# Patient Record
Sex: Female | Born: 1990 | Race: White | Hispanic: No | Marital: Married | Smoking: Never smoker
Health system: Southern US, Community
[De-identification: ages and names within clinical notes are randomized; demographics above are authoritative.]

## PROBLEM LIST (undated history)

## (undated) DIAGNOSIS — Z8619 Personal history of other infectious and parasitic diseases: Secondary | ICD-10-CM

## (undated) DIAGNOSIS — F329 Major depressive disorder, single episode, unspecified: Secondary | ICD-10-CM

## (undated) DIAGNOSIS — F419 Anxiety disorder, unspecified: Secondary | ICD-10-CM

## (undated) DIAGNOSIS — F429 Obsessive-compulsive disorder, unspecified: Secondary | ICD-10-CM

## (undated) DIAGNOSIS — F32A Depression, unspecified: Secondary | ICD-10-CM

## (undated) HISTORY — DX: Major depressive disorder, single episode, unspecified: F32.9

## (undated) HISTORY — DX: Obsessive-compulsive disorder, unspecified: F42.9

## (undated) HISTORY — DX: Personal history of other infectious and parasitic diseases: Z86.19

## (undated) HISTORY — DX: Depression, unspecified: F32.A

## (undated) HISTORY — DX: Anxiety disorder, unspecified: F41.9

---

## 2017-08-05 ENCOUNTER — Ambulatory Visit: Payer: BLUE CROSS/BLUE SHIELD | Admitting: Family Medicine

## 2017-08-05 DIAGNOSIS — M79605 Pain in left leg: Secondary | ICD-10-CM | POA: Diagnosis not present

## 2017-08-05 DIAGNOSIS — M79604 Pain in right leg: Secondary | ICD-10-CM

## 2017-08-05 NOTE — Patient Instructions (Signed)
Calf sleeves may be beneficial for overuse of your anterior compartment. For your ankle instability and this overuse do theraband strengthening exercises 3 sets of 10 each direction once a day. I don't think you need an ankle brace for this. Proprioception exercises:  Single leg stance 15 seconds, reaching 3 sets of 10, toe touch 3 sets of 10.  When these are easy you can do them standing on a pillow.  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 Straight leg raise, hip side raises, straight leg raises with foot turned outwards 3 sets of 10 once a day. Add ankle weight if these become too easy. Consider formal physical therapy. Correct foot breakdown with something like dr. Jari Sportsmanscholls active series, spencos, or our green sports insoles. Avoid flat shoes, barefoot walking as much as possible. Icing 15 minutes at a time 3-4 times a day as needed including after exercising to this and/or your shins. Tylenol or ibuprofen as needed for pain. Follow up with me in 6 weeks.

## 2017-08-09 ENCOUNTER — Encounter: Payer: Self-pay | Admitting: Family Medicine

## 2017-08-09 DIAGNOSIS — M79604 Pain in right leg: Secondary | ICD-10-CM | POA: Insufficient documentation

## 2017-08-09 DIAGNOSIS — M79605 Pain in left leg: Principal | ICD-10-CM

## 2017-08-09 NOTE — Assessment & Plan Note (Signed)
her exam is reassuring.  Location of pain along with occurring with increase in activity level consistent with patellofemoral syndrome of knees and overuse anterior compartment muscles.  Shown home exercises and stretches of ankle, lower leg as well as patellofemoral exercises.  Consider arch supports though her long arches are relatively well preserved.  Shown proprioception exercises as she's reported her right ankle has given way at times suggestive of a dynamic instability as ligaments are intact.  Icing, tylenol, ibuprofen if needed.

## 2017-08-09 NOTE — Progress Notes (Signed)
PCP and consultation requested by: Dois Davenport, MD  Subjective:   HPI: Patient is a 27 y.o. female here for bilateral lower leg pain.  Patient reports she's had over 2 months of problems with both of her lower legs. She has been working out regularly doing [employer] 3-4 times a week and wanted to make sure she wasn't damaging anything or worsening any conditions. Pain started in anterior knees, also with pain in anterior shins more to lateral side of shins into dorsal feet. States pain into dorsal feet is what limits her the most. Has not tried any medicines or icing. Just bought new shoes today but hasn't tried these. Pain is sharp. No prior issues. No skin changes, numbness.   History reviewed. No pertinent past medical history.  Current Outpatient Medications on File Prior to Visit  Medication Sig Dispense Refill  . sertraline (ZOLOFT) 100 MG tablet TK 3 TS PO D  4  . Vitamin D, Ergocalciferol, (DRISDOL) 50000 units CAPS capsule   0   No current facility-administered medications on file prior to visit.     History reviewed. No pertinent surgical history.  Not on File  Social History   Socioeconomic History  . Marital status: Married    Spouse name: Not on file  . Number of children: Not on file  . Years of education: Not on file  . Highest education level: Not on file  Occupational History  . Not on file  Social Needs  . Financial resource strain: Not on file  . Food insecurity:    Worry: Not on file    Inability: Not on file  . Transportation needs:    Medical: Not on file    Non-medical: Not on file  Tobacco Use  . Smoking status: Not on file  Substance and Sexual Activity  . Alcohol use: Not on file  . Drug use: Not on file  . Sexual activity: Not on file  Lifestyle  . Physical activity:    Days per week: Not on file    Minutes per session: Not on file  . Stress: Not on file  Relationships  . Social connections:    Talks on phone: Not on  file    Gets together: Not on file    Attends religious service: Not on file    Active member of club or organization: Not on file    Attends meetings of clubs or organizations: Not on file    Relationship status: Not on file  . Intimate partner violence:    Fear of current or ex partner: Not on file    Emotionally abused: Not on file    Physically abused: Not on file    Forced sexual activity: Not on file  Other Topics Concern  . Not on file  Social History Narrative  . Not on file    History reviewed. No pertinent family history.  BP 105/61   Ht 5\' 6"  (1.676 m)   Wt 154 lb (69.9 kg)   BMI 24.86 kg/m   Review of Systems: See HPI above.     Objective:  Physical Exam:  Gen: NAD, comfortable in exam room  Right knee: No gross deformity, ecchymoses, swelling. No TTP. FROM with 5/5 strength. Negative ant/post drawers. Negative valgus/varus testing. Negative lachmanns. Negative mcmurrays, apleys, patellar apprehension. NV intact distally.  Left knee: No gross deformity, ecchymoses, swelling. No TTP. FROM with 5/5 strength. Negative ant/post drawers. Negative valgus/varus testing. Negative lachmanns. Negative mcmurrays, apleys,  patellar apprehension. NV intact distally.  Right ankle/lower leg: No gross deformity, swelling, ecchymoses FROM with 5/5 strength TTP minimally over anterior compartment, dorsum of foot over extensor digitorum. Negative ant drawer and talar tilt.   Negative syndesmotic compression. Thompsons test negative. NV intact distally.  Left ankle/lower leg: No gross deformity, swelling, ecchymoses FROM with 5/5 strength TTP minimally over anterior compartment, dorsum of foot over extensor digitorum. Negative ant drawer and talar tilt.   Negative syndesmotic compression. Thompsons test negative. NV intact distally.   Assessment & Plan:  1. Bilateral leg pain - her exam is reassuring.  Location of pain along with occurring with increase in  activity level consistent with patellofemoral syndrome of knees and overuse anterior compartment muscles.  Shown home exercises and stretches of ankle, lower leg as well as patellofemoral exercises.  Consider arch supports though her long arches are relatively well preserved.  Shown proprioception exercises as she's reported her right ankle has given way at times suggestive of a dynamic instability as ligaments are intact.  Icing, tylenol, ibuprofen if needed.

## 2017-09-16 ENCOUNTER — Ambulatory Visit: Payer: BLUE CROSS/BLUE SHIELD | Admitting: Family Medicine

## 2017-12-10 ENCOUNTER — Ambulatory Visit: Payer: Self-pay | Admitting: Psychiatry

## 2018-02-16 ENCOUNTER — Telehealth: Payer: Self-pay | Admitting: Psychiatry

## 2018-02-16 NOTE — Telephone Encounter (Signed)
[facility] and will submit PA

## 2018-02-16 NOTE — Telephone Encounter (Signed)
Pt called to check status on PA for Sertaline 100mg  3/d Called Pt back [facility]. (Good Rx)

## 2018-03-08 ENCOUNTER — Ambulatory Visit (INDEPENDENT_AMBULATORY_CARE_PROVIDER_SITE_OTHER): Payer: PRIVATE HEALTH INSURANCE | Admitting: Psychiatry

## 2018-03-08 ENCOUNTER — Encounter: Payer: Self-pay | Admitting: Psychiatry

## 2018-03-08 DIAGNOSIS — F422 Mixed obsessional thoughts and acts: Secondary | ICD-10-CM | POA: Diagnosis not present

## 2018-03-08 DIAGNOSIS — F4001 Agoraphobia with panic disorder: Secondary | ICD-10-CM | POA: Diagnosis not present

## 2018-03-08 MED ORDER — ALPRAZOLAM 0.5 MG PO TABS: 0.5000 mg | ORAL_TABLET | Freq: Two times a day (BID) | ORAL | 0 refills | Status: DC | PRN

## 2018-03-08 MED ORDER — SERTRALINE HCL 100 MG PO TABS: 100.0000 mg | ORAL_TABLET | Freq: Every day | ORAL | 1 refills | Status: DC

## 2018-03-08 MED ORDER — SERTRALINE HCL 100 MG PO TABS: 200.0000 mg | ORAL_TABLET | Freq: Every day | ORAL | 1 refills | Status: DC

## 2018-03-08 NOTE — Progress Notes (Signed)
Gina Gillespie [id number] 05-21-1990 28 y.o.  Subjective:   Patient ID:  Gina Gillespie is a 28 y.o. (DOB 09-10-1990) female.  Chief Complaint:  Chief Complaint  Patient presents with  . Anxiety  . Follow-up    med management    HPI [name] presents to the office today for follow-up of OCD.  Good.  Pleased with Zoloft.  Will need to split the RX bc new insurance won't cover 300 mg sertraline.  Asks for a couple of Xanax when has bad panic.  It' rare.  Last one before Thanksgiving and was spontaneous.  Patient reports stable mood and denies depressed or irritable moods.  Patient denies any recent difficulty with anxiety, usually.  OCD managed generally. Patient denies difficulty with sleep initiation or maintenance. Denies appetite disturbance.  Patient reports that energy and motivation have been good.  Patient denies any difficulty with concentration.  Patient denies any suicidal ideation.  Youngest child may interfere with sleep.  2 kids  Review of Systems:  Review of Systems  Constitutional:       Night sweats   Neurological: Negative for tremors and weakness.  Psychiatric/Behavioral: Negative for agitation, behavioral problems, confusion, decreased concentration, dysphoric mood, hallucinations, self-injury, sleep disturbance and suicidal ideas. The patient is nervous/anxious. The patient is not hyperactive.     Medications: I have reviewed the patient's current medications.  Current Outpatient Medications  Medication Sig Dispense Refill  . ALPRAZolam (XANAX) 0.5 MG tablet Take 1 tablet (0.5 mg total) by mouth 2 (two) times daily as needed for anxiety. 30 tablet 0  . sertraline (ZOLOFT) 100 MG tablet Take 2 tablets (200 mg total) by mouth daily. 180 tablet 1  . sertraline (ZOLOFT) 100 MG tablet Take 1 tablet (100 mg total) by mouth daily. 90 tablet 1  . Vitamin D, Ergocalciferol, (DRISDOL) 50000 units CAPS capsule   0   No current facility-administered  medications for this visit.     Medication Side Effects: Other: reduced libido and weight gain and night sweats  Allergies: Not on File  History reviewed. No pertinent past medical history.  History reviewed. No pertinent family history.  Social History   Socioeconomic History  . Marital status: Married    Spouse name: Not on file  . Number of children: Not on file  . Years of education: Not on file  . Highest education level: Not on file  Occupational History  . Not on file  Social Needs  . Financial resource strain: Not on file  . Food insecurity:    Worry: Not on file    Inability: Not on file  . Transportation needs:    Medical: Not on file    Non-medical: Not on file  Tobacco Use  . Smoking status: Not on file  Substance and Sexual Activity  . Alcohol use: Not on file  . Drug use: Not on file  . Sexual activity: Not on file  Lifestyle  . Physical activity:    Days per week: Not on file    Minutes per session: Not on file  . Stress: Not on file  Relationships  . Social connections:    Talks on phone: Not on file    Gets together: Not on file    Attends religious service: Not on file    Active member of club or organization: Not on file    Attends meetings of clubs or organizations: Not on file    Relationship status: Not on file  . Intimate partner  violence:    Fear of current or ex partner: Not on file    Emotionally abused: Not on file    Physically abused: Not on file    Forced sexual activity: Not on file  Other Topics Concern  . Not on file  Social History Narrative  . Not on file    Past Medical History, Surgical history, Social history, and Family history were reviewed and updated as appropriate.   Please see review of systems for further details on the patient's review from today.   Objective:   Physical Exam:  There were no vitals taken for this visit.  Physical Exam Constitutional:      General: She is not in acute distress.     Appearance: She is well-developed.  Musculoskeletal:        General: No deformity.  Neurological:     Mental Status: She is alert and oriented to person, place, and time.     Motor: No tremor.     Coordination: Coordination normal.     Gait: Gait normal.  Psychiatric:        Attention and Perception: Attention normal. She is attentive.        Mood and Affect: Mood is anxious. Mood is not depressed. Affect is not labile, blunt, angry or inappropriate.        Speech: Speech normal.        Behavior: Behavior normal.        Thought Content: Thought content normal. Thought content does not include homicidal or suicidal ideation. Thought content does not include homicidal or suicidal plan.        Cognition and Memory: Cognition normal.        Judgment: Judgment normal.     Comments: Insight is good.     Lab Review:  No results found for: NA, K, CL, CO2, GLUCOSE, BUN, CREATININE, CALCIUM, PROT, ALBUMIN, AST, ALT, ALKPHOS, BILITOT, GFRNONAA, GFRAA  No results found for: WBC, RBC, HGB, HCT, PLT, MCV, MCH, MCHC, RDW, LYMPHSABS, MONOABS, EOSABS, BASOSABS  No results found for: POCLITH, LITHIUM   No results found for: PHENYTOIN, PHENOBARB, VALPROATE, CBMZ   .res Assessment: Plan:    Mixed obsessional thoughts and acts  Panic disorder with agoraphobia  Try splitting up the sertraline instead of all at HS to reduce the night sweats.  Needs high dosage for OCD.  Disc SE some of which she has.  OK rare Xanax for panic.  We discussed the short-term risks associated with benzodiazepines including sedation and increased fall risk among others.  Discussed long-term side effect risk including dependence, potential withdrawal symptoms, and the potential eventual dose-related risk of dementia.  FU 6 mos  Meredith Staggers, MD, DFAPA    Please see After Visit Summary for patient specific instructions.  No future appointments.  No orders of the defined types were placed in this encounter.      -------------------------------

## 2018-03-22 ENCOUNTER — Ambulatory Visit (INDEPENDENT_AMBULATORY_CARE_PROVIDER_SITE_OTHER): Payer: PRIVATE HEALTH INSURANCE | Admitting: Family Medicine

## 2018-03-22 ENCOUNTER — Encounter: Payer: Self-pay | Admitting: Family Medicine

## 2018-03-22 VITALS — BP 102/70 | HR 83 | Temp 97.9°F | Ht 66.0 in | Wt 154.4 lb

## 2018-03-22 DIAGNOSIS — J029 Acute pharyngitis, unspecified: Secondary | ICD-10-CM

## 2018-03-22 DIAGNOSIS — F411 Generalized anxiety disorder: Secondary | ICD-10-CM | POA: Insufficient documentation

## 2018-03-22 LAB — POCT RAPID STREP A (OFFICE): RAPID STREP A SCREEN: NEGATIVE

## 2018-03-22 MED ORDER — FLUTICASONE PROPIONATE 50 MCG/ACT NA SUSP: 2.0000 | Freq: Every day | NASAL | 6 refills | Status: DC

## 2018-03-22 NOTE — Patient Instructions (Signed)
Let me know if not better by Wednesday!  Postnasal Drip Postnasal drip is the feeling of mucus going down the back of your throat. Mucus is a slimy substance that moistens and cleans your nose and throat, as well as the air pockets in face bones near your forehead and cheeks (sinuses). Small amounts of mucus pass from your nose and sinuses down the back of your throat all the time. This is normal. When you produce too much mucus or the mucus gets too thick, you can feel it. Some common causes of postnasal drip include:  Having more mucus because of: ? A cold or the flu. ? Allergies. ? Cold air. ? Certain medicines.  Having more mucus that is thicker because of: ? A sinus or nasal infection. ? Dry air. ? A food allergy. Follow these instructions at home: Relieving discomfort   Gargle with a salt-water mixture 3-4 times a day or as needed. To make a salt-water mixture, completely dissolve -1 tsp of salt in 1 cup of warm water.  If the air in your home is dry, use a humidifier to add moisture to the air.  Use a saline spray or container (neti pot) to flush out the nose (nasal irrigation). These methods can help clear away mucus and keep the nasal passages moist. General instructions  Take over-the-counter and prescription medicines only as told by your health care provider.  Follow instructions from your health care provider about eating or drinking restrictions. You may need to avoid caffeine.  Avoid things that you know you are allergic to (allergens), like dust, mold, pollen, pets, or certain foods.  Drink enough fluid to keep your urine pale yellow.  Keep all follow-up visits as told by your health care provider. This is important. Contact a health care provider if:  You have a fever.  You have a sore throat.  You have difficulty swallowing.  You have headache.  You have sinus pain.  You have a cough that does not go away.  The mucus from your nose becomes thick  and is green or yellow in color.  You have cold or flu symptoms that last more than 10 days. Summary  Postnasal drip is the feeling of mucus going down the back of your throat.  If your health care provider approves, use nasal irrigation or a nasal spray 2?4 times a day.  Avoid things that you know you are allergic to (allergens), like dust, mold, pollen, pets, or certain foods. This information is not intended to replace advice given to you by your health care provider. Make sure you discuss any questions you have with your health care provider. Document Released: 04/28/2016 Document Revised: 04/28/2016 Document Reviewed: 04/28/2016 Elsevier Interactive Patient Education  2019 [employer].

## 2018-03-22 NOTE — Progress Notes (Signed)
Patient: Gina Gillespie MRN: [id number] DOB: 1990/02/17 PCP: Orland Mustard, MD     Subjective:  Chief Complaint  Patient presents with  . Sore Throat  . Nasal Congestion    HPI: The patient is a 28 y.o. female who presents today for sore throat and congestion. She started to feel bad over a week ago with congestion, gunk in her eyes. She states about 2 days ago her throat started to hurt, but is better today. It feels like something is in her throat. No fever/chills. Her daughter's caregiver had strep, but her daughter has been fine. No cough, shortness of breath or wheezing. She does not smoke or have asthma. No COPD. She took generic cold and sinus yesterday. She is still breast feeding her daughter.   Review of Systems  Constitutional: Negative for chills, fatigue and fever.  HENT: Positive for congestion, ear pain, postnasal drip, rhinorrhea, sore throat and trouble swallowing. Negative for sinus pressure and sinus pain.        C/o b/l ear pain, R>L  Eyes: Negative for photophobia and pain.  Respiratory: Negative for cough and shortness of breath.   Cardiovascular: Negative for chest pain.  Gastrointestinal: Negative for abdominal pain and nausea.  Musculoskeletal: Negative for arthralgias, back pain, myalgias and neck pain.  Neurological: Positive for headaches. Negative for dizziness.  Hematological: Positive for adenopathy.       C/o swelling/tenderness in submandibular glands  Psychiatric/Behavioral: Negative for sleep disturbance.    Allergies Patient has No Known Allergies.  Past Medical History Patient  has a past medical history of Anxiety and OCD (obsessive compulsive disorder).  Surgical History Patient  has no past surgical history on file.  Family History Pateint's family history is not on file.  Social History Patient  reports that she has never smoked. She has never used smokeless tobacco.    Objective: Vitals:   03/22/18 1455  BP: 102/70   Pulse: 83  Temp: 97.9 F (36.6 C)  TempSrc: Oral  SpO2: 98%  Weight: 154 lb 6.4 oz (70 kg)  Height: 5\' 6"  (1.676 m)    Body mass index is 24.92 kg/m.  Physical Exam Vitals signs reviewed.  Constitutional:      Appearance: She is well-developed.  HENT:     Head: Normocephalic and atraumatic.     Right Ear: Tympanic membrane and ear canal normal.     Left Ear: Tympanic membrane and ear canal normal.     Nose: No congestion.     Mouth/Throat:     Tonsils: No tonsillar abscesses. Swelling: 0 on the right. 0 on the left.     Comments: +cobblestoning on posterior pharynx  Neck:     Musculoskeletal: Normal range of motion and neck supple.  Cardiovascular:     Rate and Rhythm: Normal rate and regular rhythm.     Heart sounds: Normal heart sounds.  Lymphadenopathy:     Cervical: No cervical adenopathy.  Skin:    General: Skin is warm and dry.     Capillary Refill: Capillary refill takes less than 2 seconds.  Neurological:     General: No focal deficit present.     Mental Status: She is alert and oriented to person, place, and time.    Strep: negative     Assessment/plan: 1. Sore throat Likely secondary to post nasal drip vs. Viral pharyngitis. Starting her on flonase at night. Safe with breastfeeding. She is to let me know if not getting better.  - POCT rapid strep  A     Return if symptoms worsen or fail to improve.   Orland Mustard, MD [facility]   03/22/2018

## 2018-04-12 ENCOUNTER — Ambulatory Visit: Payer: Self-pay | Admitting: Family Medicine

## 2018-07-05 ENCOUNTER — Other Ambulatory Visit: Payer: Self-pay

## 2018-07-05 ENCOUNTER — Telehealth: Payer: Self-pay | Admitting: Psychiatry

## 2018-07-05 ENCOUNTER — Encounter (HOSPITAL_COMMUNITY): Payer: Self-pay

## 2018-07-05 ENCOUNTER — Inpatient Hospital Stay (HOSPITAL_COMMUNITY)
Admission: RE | Admit: 2018-07-05 | Discharge: 2018-07-07 | DRG: 832 | Disposition: A | Discharge status: Home / Self Care | Payer: PRIVATE HEALTH INSURANCE | Attending: Psychiatry | Admitting: Psychiatry

## 2018-07-05 DIAGNOSIS — R45851 Suicidal ideations: Secondary | ICD-10-CM | POA: Diagnosis not present

## 2018-07-05 DIAGNOSIS — F332 Major depressive disorder, recurrent severe without psychotic features: Secondary | ICD-10-CM | POA: Diagnosis not present

## 2018-07-05 DIAGNOSIS — F410 Panic disorder [episodic paroxysmal anxiety] without agoraphobia: Secondary | ICD-10-CM | POA: Diagnosis present

## 2018-07-05 DIAGNOSIS — Z3A01 Less than 8 weeks gestation of pregnancy: Secondary | ICD-10-CM

## 2018-07-05 DIAGNOSIS — F322 Major depressive disorder, single episode, severe without psychotic features: Secondary | ICD-10-CM | POA: Diagnosis present

## 2018-07-05 DIAGNOSIS — O99341 Other mental disorders complicating pregnancy, first trimester: Principal | ICD-10-CM | POA: Diagnosis present

## 2018-07-05 MED ORDER — ALUM & MAG HYDROXIDE-SIMETH 200-200-20 MG/5ML PO SUSP: 30.0000 mL | ORAL | Status: DC | PRN

## 2018-07-05 MED ORDER — SERTRALINE HCL 100 MG PO TABS
200.0000 mg | ORAL_TABLET | Freq: Every day | ORAL | Status: DC
  Administered 2018-07-05 – 2018-07-06 (×2): 200 mg via ORAL
  Filled 2018-07-05 (×4): qty 2

## 2018-07-05 MED ORDER — ACETAMINOPHEN 325 MG PO TABS: 650.0000 mg | ORAL_TABLET | Freq: Four times a day (QID) | ORAL | Status: DC | PRN

## 2018-07-05 MED ORDER — MAGNESIUM HYDROXIDE 400 MG/5ML PO SUSP: 30.0000 mL | Freq: Every day | ORAL | Status: DC | PRN

## 2018-07-05 MED ORDER — DIPHENHYDRAMINE HCL 25 MG PO CAPS
25.0000 mg | ORAL_CAPSULE | Freq: Once | ORAL | Status: AC | PRN
  Administered 2018-07-05: 22:00:00 25 mg via ORAL
  Filled 2018-07-05: qty 1

## 2018-07-05 NOTE — Plan of Care (Signed)
[place] Observation Crisis Plan  Reason for Crisis Plan:  Crisis Stabilization   Plan of Care:  Referral for Telepsychiatry/Psychiatric Consult  Family Support:      Current Living Environment:  Living Arrangements: Spouse/significant other, Children  Insurance:   Hospital Account    Name Acct ID Class Status Primary Coverage   Gina Gillespie, Gina Gillespie [id number] [place]        Guarantor Account (for Hospital Account [id number])    Name Relation to Pt Service Area Active? Acct Type   [name] [facility] Yes [facility]al [facility]   Address Phone       [address] [place], [place] [address] [phone])          Coverage Information (for Hospital Account [id number])    F/O Payor/Plan Precert #   [place] #   Gina Gillespie, Gina Gillespie [id number]   Address Phone   [place] [address] [phone]      Legal Guardian:  Legal Guardian: Other:(N/A)  Primary Care Provider:  Orma Flaming, MD  Current Outpatient Providers:  [facility]  Psychiatrist:  Name of Psychiatrist: Dr. Clovis Pu - [facility]; has been seeing for approximately 3 years  Counselor/Therapist:  Name of Therapist: None  Compliant with Medications:  Yes  Additional Information:   Gina Gillespie 6/8/202010:03 PM

## 2018-07-05 NOTE — Telephone Encounter (Signed)
Phone call after hours at [place] patient having acute marital distress of several days with sobbng anxious breakdown tonight when husband was crying telling her to go back to her country.  She states neither has family in the area but she wants to take care of her children and live long enough to do so.  She has a death wish about which she is obsessing to the point that she expects she will kill herself if not provided protection.  She delivered her 2 children at home as she is afraid of hospitals likely obsessing about contamination and death.  She had therapy with Barron Schmid, PhD until discontinuing reporting she needs a female therapist but did not find one right away.  She apparently takes sertraline from Dr. Clovis Pu and at last appointment in February was apparently doing well.  She is highly concerned about economics and suggests her phone is nearly out of power so that she will use it to drive herself to [facility] for being there before but knowing the way as she lives in the area.  She is less threatening by the end of the phone intervention but does not acknowledge feeling or functioning better otherwise she carefully plans to seek emergency assessment by access and intake crisis at [facility] for possible acute admission.

## 2018-07-05 NOTE — Progress Notes (Signed)
Gina Gillespie is a 28 year old female being admitted to [facility] Obs unit room 203 as a walk in to [facility].  She came in for suicidal ideation that has worsened over the past 2 days.  She reported thinking about running into traffic.  Current stressors are husband becoming distant due to her extra marital affair, being pregnant, unsure who the baby's father is and history of anxiety/OCD/depression.  During [facility] Obs admission, she continued to report passive SI and will seek out staff if those feelings worsen.  She denied HI or A/V hallucinations.  She was tearful during admission but was appropriate during admission.  She denied any pain or discomfort and appeared to be in no physical distress.  Oriented her to the unit.  BH-OBS paperwork completed and signed.  Belongings secured in white belongings bag and placed in locker # 67.  No contraband found.  Skin assessment completed and no skin issues noted.  Q 15 minute checks initiated for safety.

## 2018-07-05 NOTE — H&P (Signed)
Behavioral Health Medical Screening Exam  Gina Gillespie is an 28 y.o. female.  Total Time spent with patient: 30 minutes  Psychiatric Specialty Exam: Physical Exam  Constitutional: She is oriented to person, place, and time. She appears well-developed and well-nourished. No distress.  HENT:  Head: Normocephalic and atraumatic.  Right Ear: External ear normal.  Eyes: Pupils are equal, round, and reactive to light. Conjunctivae are normal. Right eye exhibits no discharge.  Respiratory: Effort normal. No respiratory distress.  Musculoskeletal: Normal range of motion.  Neurological: She is alert and oriented to person, place, and time.  Skin: She is not diaphoretic.  Psychiatric: Her mood appears anxious. She is not withdrawn and not actively hallucinating. Thought content is not paranoid and not delusional. She exhibits a depressed mood. She expresses suicidal ideation. She expresses no homicidal ideation. She expresses suicidal plans.    Review of Systems  Constitutional: Negative for chills, diaphoresis, fever, malaise/fatigue and weight loss.  Respiratory: Negative for cough and shortness of breath.   Cardiovascular: Negative for chest pain.  Gastrointestinal: Negative for diarrhea, nausea and vomiting.  Psychiatric/Behavioral: Positive for depression and suicidal ideas. Negative for hallucinations, memory loss and substance abuse. The patient is nervous/anxious. The patient does not have insomnia.     There were no vitals taken for this visit.There is no height or weight on file to calculate BMI.  General Appearance: Casual and Fairly Groomed  Eye Contact:  Minimal  Speech:  Clear and Coherent and Normal Rate  Volume:  Decreased  Mood:  Anxious, Depressed, Hopeless and Worthless  Affect:  Congruent and Tearful  Thought Process:  Coherent, Goal Directed and Descriptions of Associations: Intact  Orientation:  Full (Time, Place, and Person)  Thought Content:  Logical and  Hallucinations: None  Suicidal Thoughts:  Yes.  with intent/plan  Homicidal Thoughts:  No  Memory:  Immediate;   Good Recent;   Fair  Judgement:  Impaired  Insight:  Lacking  Psychomotor Activity:  Normal  Concentration: Concentration: Fair and Attention Span: Fair  Recall:  Good  Fund of Knowledge:Good  Language: Good  Akathisia:  Negative  Handed:  Right  AIMS (if indicated):     Assets:  Communication Skills Desire for Improvement Financial Resources/Insurance Housing Leisure Time Physical Health  Sleep:       Musculoskeletal: Strength & Muscle Tone: within normal limits Gait & Station: normal    Recommendations:  Based on my evaluation the patient does not appear to have an emergency medical condition.  Rozetta Nunnery, NP 07/05/2018, 9:41 PM

## 2018-07-05 NOTE — Telephone Encounter (Signed)
Please ask what sort of symptoms she is having that she feels warrants some sort of residential psychiatric treatment and I will respond.  This is new information to me
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
## 2018-07-05 NOTE — Telephone Encounter (Signed)
Patient would like to speak with you about getting a referral to residential psychiatric place that she can possibly go to.

## 2018-07-06 DIAGNOSIS — O99341 Other mental disorders complicating pregnancy, first trimester: Secondary | ICD-10-CM | POA: Diagnosis not present

## 2018-07-06 DIAGNOSIS — Z3A01 Less than 8 weeks gestation of pregnancy: Secondary | ICD-10-CM | POA: Diagnosis not present

## 2018-07-06 DIAGNOSIS — R45851 Suicidal ideations: Secondary | ICD-10-CM | POA: Diagnosis present

## 2018-07-06 DIAGNOSIS — F332 Major depressive disorder, recurrent severe without psychotic features: Secondary | ICD-10-CM

## 2018-07-06 DIAGNOSIS — F410 Panic disorder [episodic paroxysmal anxiety] without agoraphobia: Secondary | ICD-10-CM | POA: Diagnosis not present

## 2018-07-06 DIAGNOSIS — F322 Major depressive disorder, single episode, severe without psychotic features: Secondary | ICD-10-CM | POA: Diagnosis present

## 2018-07-06 LAB — RAPID URINE DRUG SCREEN, HOSP PERFORMED
Amphetamines: NOT DETECTED
Barbiturates: NOT DETECTED
Benzodiazepines: POSITIVE — AB
Cocaine: NOT DETECTED
Opiates: NOT DETECTED
Tetrahydrocannabinol: NOT DETECTED

## 2018-07-06 LAB — COMPREHENSIVE METABOLIC PANEL
ALT: 17 U/L (ref 0–44)
AST: 16 U/L (ref 15–41)
Albumin: 3.9 g/dL (ref 3.5–5.0)
Alkaline Phosphatase: 52 U/L (ref 38–126)
Anion gap: 7 (ref 5–15)
BUN: 10 mg/dL (ref 6–20)
CO2: 25 mmol/L (ref 22–32)
Calcium: 8.7 mg/dL — ABNORMAL LOW (ref 8.9–10.3)
Chloride: 106 mmol/L (ref 98–111)
Creatinine, Ser: 0.61 mg/dL (ref 0.44–1.00)
GFR calc Af Amer: >60 mL/min (ref >60)
GFR calc non Af Amer: >60 mL/min (ref >60)
Glucose, Bld: 99 mg/dL (ref 70–99)
Potassium: 3.9 mmol/L (ref 3.5–5.1)
Sodium: 138 mmol/L (ref 135–145)
Total Bilirubin: 0.5 mg/dL (ref 0.3–1.2)
Total Protein: 6.5 g/dL (ref 6.5–8.1)

## 2018-07-06 LAB — CBC
HCT: 38.5 % (ref 36.0–46.0)
Hemoglobin: 12.4 g/dL (ref 12.0–15.0)
MCH: 30 pg (ref 26.0–34.0)
MCHC: 32.2 g/dL (ref 30.0–36.0)
MCV: 93 fL (ref 80.0–100.0)
Platelets: 235 10*3/uL (ref 150–400)
RBC: 4.14 MIL/uL (ref 3.87–5.11)
RDW: 12.8 % (ref 11.5–15.5)
WBC: 6.7 10*3/uL (ref 4.0–10.5)
nRBC: 0 % (ref 0.0–0.2)

## 2018-07-06 LAB — LIPID PANEL
Cholesterol: 164 mg/dL (ref 0–200)
HDL: 55 mg/dL (ref >40)
LDL Cholesterol: 101 mg/dL — ABNORMAL HIGH (ref 0–99)
Total CHOL/HDL Ratio: 3 RATIO
Triglycerides: 38 mg/dL (ref <150)
VLDL: 8 mg/dL (ref 0–40)

## 2018-07-06 LAB — HEMOGLOBIN A1C
Hgb A1c MFr Bld: 5.2 % (ref 4.8–5.6)
Mean Plasma Glucose: 102.54 mg/dL

## 2018-07-06 LAB — PREGNANCY, URINE: Preg Test, Ur: POSITIVE — AB

## 2018-07-06 LAB — TSH: TSH: 1.975 u[IU]/mL (ref 0.350–4.500)

## 2018-07-06 MED ORDER — DIPHENHYDRAMINE HCL 25 MG PO CAPS: 50.0000 mg | ORAL_CAPSULE | Freq: Every evening | ORAL | Status: DC | PRN

## 2018-07-06 NOTE — Progress Notes (Signed)
Patient ID: Gina Gillespie, female   DOB: 11-14-1990, 28 y.o.   MRN: [id number]  Admission Note  D) Patient admitted to the adult unit 400 hall. Patient is a 28 year old female who is voluntary from [employer]. Patient presents minimal and guarded stating she does not intend to stay the night here. Patient informed she was being admitted to the unit, however patient states she only agreed to talked to the psychiatrist. Patient came to [facility] voluntarily reporting thoughts of SI. Patient reports she is pregnant and does not know if the baby's father is her husband's or another man's. Patient has history of post partum depression and two other children. Patient minimizing thoughts of SI now and denies HI/AVH.  Skin assessment was completed and unremarkable. Patient belongings searched with no contraband found. Belongings secured in locker. Vital signs obtained.   A) Plan of care, unit policies and patient expectations were explained. Written consents obtained. Patient oriented to the unit and their room. Patient placed on standard q15 safety checks. Low fall risk precautions initiated and reviewed with patient.   R) Patient is in no acute distress and verbalizes understanding of information provided. Patient with no concerns at this time. Patient contracts for safety with staff on the unit.

## 2018-07-06 NOTE — Progress Notes (Signed)
D: Pt denies SI/HI/AVH. Pt is pleasant and cooperative. Pt had minimal information , but seemed as if she were  minimizing her situation. Pt visible on the unit some this evening.    A: Pt was offered support and encouragement. Pt was given scheduled medications. Pt was encourage to attend groups. Q 15 minute checks were done for safety.   R:Pt attends groups and interacts well with peers and staff. Pt is taking medication. Pt has no complaints.Pt receptive to treatment and safety maintained on unit.

## 2018-07-06 NOTE — Discharge Summary (Signed)
  Patient transferred inpatient at [place] for psychiatric treatment  Ritchard Paragas B. Alivia Cimino, NP

## 2018-07-06 NOTE — Progress Notes (Signed)
Spiritual care group on grief and loss facilitated by chaplain Raymone Pembroke  Group Goal:  Support / Education around grief and loss Members engage in facilitated group support and psycho social education.  Group Description:  Following introductions and group rules,  Group members engaged in facilitated group dialog and support around topic of loss, with particular support around experiences of loss in their lives. Group Identified types of loss (relationships / self / things) and identified patterns, circumstances, and changes that precipitate losses. Reflected on thoughts / feelings around loss, normalized grief responses, and recognized variety in grief experience. Patient Progress:  Pt did not attend group 
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
## 2018-07-06 NOTE — Progress Notes (Signed)
Patient ID: Gina Gillespie, female   DOB: 02-15-1990, 28 y.o.   MRN: [id number]  [place] NOVEL CORONAVIRUS (COVID-19) DAILY CHECK-OFF SYMPTOMS - answer yes or no to each - every day NO YES  Have you had a fever in the past 24 hours?  . Fever (Temp > 37.80C / 100F) X   Have you had any of these symptoms in the past 24 hours? . New Cough .  Sore Throat  .  Shortness of Breath .  Difficulty Breathing .  Unexplained Body Aches   X   Have you had any one of these symptoms in the past 24 hours not related to allergies?   . Runny Nose .  Nasal Congestion .  Sneezing   X   If you have had runny nose, nasal congestion, sneezing in the past 24 hours, has it worsened?  X   EXPOSURES - check yes or no X   Have you traveled outside the state in the past 14 days?  X   Have you been in contact with someone with a confirmed diagnosis of COVID-19 or PUI in the past 14 days without wearing appropriate PPE?  X   Have you been living in the same home as a person with confirmed diagnosis of COVID-19 or a PUI (household contact)?    X   Have you been diagnosed with COVID-19?    X              What to do next: Answered NO to all: Answered YES to anything:   Proceed with unit schedule Follow the [facility] Inpatient Flowsheet.

## 2018-07-06 NOTE — Progress Notes (Signed)
Adult Psychoeducational Group Note  Date:  07/06/2018 Time:  9:09 PM  Group Topic/Focus:  Wrap-Up Group:   The focus of this group is to help patients review their daily goal of treatment and discuss progress on daily workbooks.  Participation Level:  Active  Participation Quality:  Appropriate  Affect:  Appropriate  Cognitive:  Appropriate  Insight: Appropriate  Engagement in Group:  Engaged  Modes of Intervention:  Discussion  Additional Comments:  Patient attended group and said her day was a 6. She described her day as being restful.  Priscila Bean W Ersie Savino 09/02/5641, 9:09 PM

## 2018-07-06 NOTE — H&P (Signed)
Psychiatric Admission Assessment Adult  Patient Identification: Gina Gillespie MRN:  [id number] Date of Evaluation:  07/06/2018 Chief Complaint: " I needed to be in a safe place "   Principal Diagnosis: MDD versus Adjustment Disorder with Depressed Mood. OCD per history.  Diagnosis:   MDD versus Adjustment Disorder with Depressed Mood. OCD per history. History of Present Illness:  Patient is a 28 year old married female, presented to ED voluntarily on 6/8, due to depression, suicidal ideations, with thoughts of crashing car. Reports significant stressors, including marital stressors- reports she had recently told him about an extramarital affair, and she is unsure if marriage will survive. She also recently found out she is pregnant ( currently 5 weeks) .  States that her husband has been distant and not wanting to talk to her since then. States that prior to these stressors she was " doing OK" and was not feeling particularly depressed . Over the last several days has been feeling depressed , ruminative about her stressors. Endorses neuro-vegetative symptoms as below.  Of note, reports she has been on Zoloft for several years for management of OCD .     Associated Signs/Symptoms: Depression Symptoms:  depressed mood, anhedonia, insomnia, suicidal thoughts with specific plan, anxiety, loss of energy/fatigue, decreased appetite, (Hypo) Manic Symptoms:  Does not endorse or present Anxiety Symptoms:  Describes history of OCD diagnosis, and reports obsessive, ruminative ideations.  Psychotic Symptoms:  Denies  PTSD Symptoms: Reports history of PTSD symptoms following a break in years ago, but states symptoms have improved overtime and currently does not endorse significant symptoms of PTSD.  Total Time spent with patient: 45 minutes  Past Psychiatric History: no prior psychiatric admissions , no history of prior suicide attempts, no history of self cutting, denies history of psychosis, denies  PTSD symptoms at this time.  Reports she has been diagnosed with OCD in the past- describes symptoms mainly as  " I worry a lot , I can't stop thinking " . She also reports history of obsessive/recurrent sexual ideations, which she feels may have contributed to recent infidelity . Reports frequent anxious ruminations about her physical health. Denies history of mania Reports past history of depression, and reports she has been diagnosed with postpartum depression during prior pregnancy. Denies history of violence . Describes occasional panic attacks, denies agoraphobia.   Is the patient at risk to self? Yes.    Has the patient been a risk to self in the past 6 months? Yes.    Has the patient been a risk to self within the distant past? No.  Is the patient a risk to others? No.  Has the patient been a risk to others in the past 6 months? No.  Has the patient been a risk to others within the distant past? No.   Prior Inpatient Therapy: Prior Inpatient Therapy: No Prior Outpatient Therapy: Prior Outpatient Therapy: Yes Prior Therapy Dates: 2 1/2 years ago for approximately 18 months Prior Therapy Facilty/Provider(s): Dr. Farrel Demark - [facility] Reason for Treatment: MDD, OCD Does patient have an ACCT team?: No Does patient have Intensive In-House Services?  : No Does patient have [facility] services? : No Does patient have P4CC services?: No  Alcohol Screening: 1. How often do you have a drink containing alcohol?: Never 2. How many drinks containing alcohol do you have on a typical day when you are drinking?: 1 or 2 3. How often do you have six or more drinks on one occasion?: Never AUDIT-C Score: 0 9.  Have you or someone else been injured as a result of your drinking?: No 10. Has a relative or friend or a doctor or another health worker been concerned about your drinking or suggested you cut down?: No Alcohol Use Disorder Identification Test Final Score (AUDIT): 0 Alcohol Brief  Interventions/Follow-up: AUDIT Score <7 follow-up not indicated Substance Abuse History in the last 12 months:  Denies drug or alcohol abuse  Consequences of Substance Abuse: Denies  Previous Psychotropic Medications: reports she has been on Zoloft ( at 300 mgrs QDAY over the last several months ) , over the last few years . She has been on Lexapro in the past . She reports she feels Zoloft has been helpful and well tolerated ( does describe some diaphoresis/excessive sweating)  Psychological Evaluations:  No  Past Medical History: Denies medical illnesses , currently pregnant ( 5 weeks) , NKDA. Past Medical History:  Diagnosis Date  . Anxiety   . Depression   . History of chicken pox   . OCD (obsessive compulsive disorder)    History reviewed. No pertinent surgical history. Family History: parents alive, live together, [place] the [place], has one brother.  Family History  Problem Relation Age of Onset  . Alcohol abuse Father   . Cancer Father   . Depression Father   . Mental illness Father   . Mental illness Brother   . Arthritis Maternal Grandmother   . Depression Maternal Grandmother   . Diabetes Maternal Grandmother   . Hearing loss Maternal Grandmother   . Hypertension Maternal Grandmother   . Alcohol abuse Maternal Grandfather   . Cancer Maternal Grandfather   . Early death Paternal Grandmother   . Alcohol abuse Paternal Grandfather   . Cancer Paternal Grandfather    Family Psychiatric  History:  Father has a history of depression, exacerbated related to history of pharyngeal cancer. Father and both grandparents have history of alcohol use disorder. Her paternal grandmother may have committed suicide. Tobacco Screening: Have you used any form of tobacco in the last 30 days? (Cigarettes, Smokeless Tobacco, Cigars, and/or Pipes): No Social History: married, has two children ( 4,2) , currently with their father, employed.  Social History   Substance and Sexual Activity   Alcohol Use Not Currently     Social History   Substance and Sexual Activity  Drug Use Never    Additional Social History: Marital status: Married Number of Years Married: 6 What types of issues is patient dealing with in the relationship?: History of extra-maritial affairs. The patient and her husband have tried couples counseling twice before.  Additional relationship information: Rcently cheated on her husband, she's now [redacted] weeks pregnant and isn't sure who the father of her child is. Husband doesn't want her home at this time. Are you sexually active?: Yes What is your sexual orientation?: Straight Has your sexual activity been affected by drugs, alcohol, medication, or emotional stress?: Stress Does patient have children?: Yes How many children?: 2 How is patient's relationship with their children?: Ages 4 and 2, misses them very much while inpatient.    Pain Medications: Please see MAR Prescriptions: Please see MAR Over the Counter: Please see MAR History of alcohol / drug use?: No history of alcohol / drug abuse Longest period of sobriety (when/how long): Pt denies SA  Allergies:  No Known Allergies Lab Results:  Results for orders placed or performed during the hospital encounter of 07/05/18 (from the past 48 hour(s))  Urine rapid drug screen (hosp performed)not  at [facility]     Status: Abnormal   Collection Time: 07/05/18  9:47 PM  Result Value Ref Range   Opiates NONE DETECTED NONE DETECTED   Cocaine NONE DETECTED NONE DETECTED   Benzodiazepines POSITIVE (A) NONE DETECTED   Amphetamines NONE DETECTED NONE DETECTED   Tetrahydrocannabinol NONE DETECTED NONE DETECTED   Barbiturates NONE DETECTED NONE DETECTED    Comment: (NOTE) DRUG SCREEN FOR MEDICAL PURPOSES ONLY.  IF CONFIRMATION IS NEEDED FOR ANY PURPOSE, NOTIFY LAB WITHIN 5 DAYS. LOWEST DETECTABLE LIMITS FOR URINE DRUG SCREEN Drug Class                     Cutoff (ng/mL) Amphetamine and metabolites     1000 Barbiturate and metabolites    200 Benzodiazepine                 200 Tricyclics and metabolites     300 Opiates and metabolites        300 Cocaine and metabolites        300 THC                            50 Performed at [facility], [address] [place], [place] [address]   Pregnancy, urine     Status: Abnormal   Collection Time: 07/05/18  9:47 PM  Result Value Ref Range   Preg Test, Ur POSITIVE (A) NEGATIVE    Comment:        THE SENSITIVITY OF THIS METHODOLOGY IS >20 mIU/mL. Performed at [facility], [address] [place], [place] [address]   CBC     Status: None   Collection Time: 07/06/18  6:51 AM  Result Value Ref Range   WBC 6.7 4.0 - 10.5 K/uL   RBC 4.14 3.87 - 5.11 MIL/uL   Hemoglobin 12.4 12.0 - 15.0 g/dL   HCT 09.838.5 11.936.0 - 14.746.0 %   MCV 93.0 80.0 - 100.0 fL   MCH 30.0 26.0 - 34.0 pg   MCHC 32.2 30.0 - 36.0 g/dL   RDW 82.912.8 56.211.5 - 13.015.5 %   Platelets 235 150 - 400 K/uL   nRBC 0.0 0.0 - 0.2 %    Comment: Performed at [facility], [address] [place], [place] [address]  Comprehensive metabolic panel     Status: Abnormal   Collection Time: 07/06/18  6:51 AM  Result Value Ref Range   Sodium 138 135 - 145 mmol/L   Potassium 3.9 3.5 - 5.1 mmol/L   Chloride 106 98 - 111 mmol/L   CO2 25 22 - 32 mmol/L   Glucose, Bld 99 70 - 99 mg/dL   BUN 10 6 - 20 mg/dL   Creatinine, Ser 4.690.61 0.44 - 1.00 mg/dL   Calcium 8.7 (L) 8.9 - 10.3 mg/dL   Total Protein 6.5 6.5 - 8.1 g/dL   Albumin 3.9 3.5 - 5.0 g/dL   AST 16 15 - 41 U/L   ALT 17 0 - 44 U/L   Alkaline Phosphatase 52 38 - 126 U/L   Total Bilirubin 0.5 0.3 - 1.2 mg/dL   GFR calc non Af Amer >60 >60 mL/min   GFR calc Af Amer >60 >60 mL/min   Anion gap 7 5 - 15    Comment: Performed at [facility], [address] [place], [place] [address]  Hemoglobin A1c     Status: None  Collection Time: 07/06/18  6:51 AM  Result Value Ref Range    Hgb A1c MFr Bld 5.2 4.8 - 5.6 %    Comment: (NOTE) Pre diabetes:          5.7%-6.4% Diabetes:              >6.4% Glycemic control for   <7.0% adults with diabetes    Mean Plasma Glucose 102.54 mg/dL    Comment: Performed at [facility] [address] [place], [place] [address]  Lipid panel     Status: Abnormal   Collection Time: 07/06/18  6:51 AM  Result Value Ref Range   Cholesterol 164 0 - 200 mg/dL   Triglycerides 38 <150 mg/dL   HDL 55 >40 mg/dL   Total CHOL/HDL Ratio 3.0 RATIO   VLDL 8 0 - 40 mg/dL   LDL Cholesterol 101 (H) 0 - 99 mg/dL    Comment:        Total Cholesterol/HDL:CHD Risk Coronary Heart Disease Risk Table                     Men   Women  1/2 Average Risk   3.4   3.3  Average Risk       5.0   4.4  2 X Average Risk   9.6   7.1  3 X Average Risk  23.4   11.0        Use the calculated Patient Ratio above and the CHD Risk Table to determine the patient's CHD Risk.        ATP III CLASSIFICATION (LDL):  <100     mg/dL   Optimal  100-129  mg/dL   Near or Above                    Optimal  130-159  mg/dL   Borderline  160-189  mg/dL   High  >190     mg/dL   Very High Performed at [facility] [address], [place], [place] [address]   TSH     Status: None   Collection Time: 07/06/18  6:51 AM  Result Value Ref Range   TSH 1.975 0.350 - 4.500 uIU/mL    Comment: Performed by a 3rd Generation assay with a functional sensitivity of <=0.01 uIU/mL. Performed at [facility], [place] [address] [place], [place] [address]     Blood Alcohol level:  No results found for: [facility]  Metabolic Disorder Labs:  Lab Results  Component Value Date   HGBA1C 5.2 07/06/2018   MPG 102.54 07/06/2018   No results found for: PROLACTIN Lab Results  Component Value Date   CHOL 164 07/06/2018   TRIG 38 07/06/2018   HDL 55 07/06/2018   CHOLHDL 3.0 07/06/2018   VLDL 8 07/06/2018   LDLCALC 101 (H) 07/06/2018    Current  Medications: Current Facility-Administered Medications  Medication Dose Route Frequency Provider Last Rate Last Dose  . acetaminophen (TYLENOL) tablet 650 mg  650 mg Oral Q6H PRN Rankin, Shuvon B, NP      . alum & mag hydroxide-simeth (MAALOX/MYLANTA) 200-200-20 MG/5ML suspension 30 mL  30 mL Oral Q4H PRN Rankin, Shuvon B, NP      . magnesium hydroxide (MILK OF MAGNESIA) suspension 30 mL  30 mL Oral Daily PRN Rankin, Shuvon B, NP      . sertraline (ZOLOFT) tablet 200 mg  200 mg Oral QHS Rankin, Shuvon B, NP  200 mg at 07/05/18 2156   PTA Medications: Medications Prior to Admission  Medication Sig Dispense Refill Last Dose  . fluticasone (FLONASE) 50 MCG/ACT nasal spray Place 2 sprays into both nostrils daily. 16 g 6   . sertraline (ZOLOFT) 100 MG tablet Take 1 tablet (100 mg total) by mouth daily. (Patient taking differently: Take 200 mg by mouth daily. ) 90 tablet 1 Taking  . ALPRAZolam (XANAX) 0.5 MG tablet Take 1 tablet (0.5 mg total) by mouth 2 (two) times daily as needed for anxiety. (Patient not taking: Reported on 03/22/2018) 30 tablet 0 Not Taking at Unknown time    Musculoskeletal: Strength & Muscle Tone: within normal limits Gait & Station: normal Patient leans: N/A  Psychiatric Specialty Exam: Physical Exam  Review of Systems  Constitutional: Negative for chills and fever.  HENT: Negative.   Respiratory: Negative for cough and shortness of breath.   Cardiovascular: Negative for chest pain.  Gastrointestinal: Negative for nausea and vomiting.  Genitourinary: Negative for dysuria, frequency and urgency.  Musculoskeletal: Negative.   Skin: Negative for rash.  Neurological: Negative for seizures and headaches.  Endo/Heme/Allergies: Negative.   Psychiatric/Behavioral: Positive for depression and suicidal ideas. The patient is nervous/anxious.     Blood pressure 98/75, pulse 99, temperature 98.6 F (37 C), temperature source Oral, resp. rate 18, height 5\' 5"  (1.651 m),  weight 66.7 kg.Body mass index is 24.46 kg/m.  General Appearance: Fairly Groomed  Eye Contact:  Fair  Speech:  Normal Rate  Volume:  Normal  Mood:  Depressed  Affect:  constricted, intermittently tearful  Thought Process:  Linear and Descriptions of Associations: Intact  Orientation:  Other:  fully alert and attentive  Thought Content:  no hallucinations, no delusions, not internally preoccupied   Suicidal Thoughts:  No denies current suicidal or self injurious ideations, denies homicidal or violent ideations, contracts for safety on the unit   Homicidal Thoughts:  No  Memory:  recent and remote grossly intact  Judgement:  Fair  Insight:  Fair  Psychomotor Activity:  Normal  Concentration:  Concentration: Good and Attention Span: Good  Recall:  Good  Fund of Knowledge:  Good  Language:  Good  Akathisia:  Negative  Handed:  Right  AIMS (if indicated):     Assets:  Communication Skills Desire for Improvement Resilience  ADL's:  Intact  Cognition:  WNL  Sleep:       Treatment Plan Summary: Daily contact with patient to assess and evaluate symptoms and progress in treatment, Medication management, Plan inpatient treatment  and medications as below  Observation Level/Precautions:  15 minute checks  Laboratory:  as needed   Psychotherapy:  Milieu, group therapy   Medications: we discussed medication side effects . She reports she has been on Zoloft 300 mgrs QDAY for several months, and on Zoloft for several years, including during prior pregnancies . She states Zoloft has historically been well tolerated and effective .  For now continue Zoloft 200 mgrs QDAY . We have reviewed and discussed  risks associated with pregnancy status . Start prenatal vitamin.    Consultations:  As needed   Discharge Concerns: -   Estimated LOS: 3-4 days  Other:     Physician Treatment Plan for Primary Diagnosis: Depression, consider MDD versus Adjustment Disorder Long Term Goal(s): Improvement  in symptoms so as ready for discharge  Short Term Goals: Ability to identify changes in lifestyle to reduce recurrence of condition will improve, Ability to verbalize feelings will improve, Ability  to disclose and discuss suicidal ideas, Ability to demonstrate self-control will improve, Ability to identify and develop effective coping behaviors will improve and Ability to maintain clinical measurements within normal limits will improve  Physician Treatment Plan for Secondary Diagnosis: OCD by history  Long Term Goal(s): Improvement in symptoms so as ready for discharge  Short Term Goals: Ability to identify changes in lifestyle to reduce recurrence of condition will improve, Ability to verbalize feelings will improve, Ability to disclose and discuss suicidal ideas, Ability to demonstrate self-control will improve, Ability to identify and develop effective coping behaviors will improve and Ability to maintain clinical measurements within normal limits will improve  I certify that inpatient services furnished can reasonably be expected to improve the patient's condition.    Craige CottaFernando A Towana Stenglein, MD 6/9/20201:41 PM

## 2018-07-06 NOTE — H&P (Signed)
BH Observation Unit Provider Admission PAA/H&P  Patient Identification: Gina Gillespie Marse MRN:  [id number] Date of Evaluation:  07/06/2018 Chief Complaint:  mdd Principal Diagnosis: <principal problem not specified> Diagnosis:  Active Problems:   Severe recurrent major depression without psychotic features (HCC)  History of Present Illness:  TTS assessment:  Gina Gillespie Basic is a 28 y.o. female [facility] to experiencing SI. Pt shares she has experienced SI for "a long time," though it has been worse the last several months due to having an extra-marital affair, which has resulted in her husband becoming distant and unsupportive of her during this time. Pt states it was confirmed today that she is 5 weeks 2 days pregnant and that she is unsure if her husband or if she man she had an affair with is the father of the baby. Pt expressed thinking she would be scared to kill herself, though she identifies that she cannot keep herself safe tonight.  Pt states she has never been in a hospital for mental health reasons; she expresses having severe concerns about hospitals and that she delivered her two children at home due to wanting to stay out of a hospital. Pt states she has experienced post-partum depression with both of her children, but that the post-partum has continued years after her children were born; her younger child is now 852 1/91 years old. She denies prior attempts to kill herself, though she's had these thoughts for years. Pt denies HI, AVH, NSSIB, SA, access to guns/weapons, or involvement in the legal system.  Pt declined to have clinician contact her husband at this time, stating he is upset with her and is not sympathetic to what she is feeling.   Telephone note from Dr. Marlyne BeardsJennings:  Phone call after hours at 1935 patient having acute marital distress of several days with sobbng anxious breakdown tonight when husband was crying telling her to go back to her  country.  She states neither has family in the area but she wants to take care of her children and live long enough to do so.  She has a death wish about which she is obsessing to the point that she expects she will kill herself if not provided protection.  She delivered her 2 children at home as she is afraid of hospitals likely obsessing about contamination and death.  She had therapy with Laurell RoofAndrew Mitchum, PhD until discontinuing reporting she needs a female therapist but did not find one right away.  She apparently takes sertraline from Dr. Jennelle Humanottle and at last appointment in February was apparently doing well.  She is highly concerned about economics and suggests her phone is nearly out of power so that she will use it to drive [facility] for being there before but knowing the way as she lives in the area.  She is less threatening by the end of the phone intervention but does not acknowledge feeling or functioning better otherwise she carefully plans to seek emergency assessment by access and intake crisis [facility] for possible acute [facility].   On evaluation patient is alert and oriented x 4, pleasant, and cooperative. Speech is clear and coherent. Mood is depressed and affect is congruent with mood. Thought process is coherent and thought process is logical. Tearful throughout assessment. Endorses SI with plan to walk into traffic. Denies homicidal ideations. Denies substance abuse. Denies audiovisual hallucinations. No indication that patient is responding to internal stimuli.    Associated  Signs/Symptoms: Depression Symptoms:  depressed mood, anhedonia, feelings of worthlessness/guilt, difficulty concentrating, hopelessness, suicidal thoughts with specific plan, anxiety, decreased appetite, (Hypo) Manic Symptoms:  denies Anxiety Symptoms:  Excessive Worry, Psychotic Symptoms:  Denies PTSD Symptoms: Negative Total Time spent with patient: 30  minutes  Past Psychiatric History: OCD, MDD, Postpartum depression  Is the patient at risk to self? Yes.    Has the patient been a risk to self in the past 6 months? No.  Has the patient been a risk to self within the distant past? Yes.    Is the patient a risk to others? No.  Has the patient been a risk to others in the past 6 months? No.  Has the patient been a risk to others within the distant past? No.   Prior Inpatient Therapy: Prior Inpatient Therapy: No Prior Outpatient Therapy: Prior Outpatient Therapy: Yes Prior Therapy Dates: 2 1/2 years ago for approximately 18 months Prior Therapy Facilty/Provider(s): Dr. Farrel DemarkMitchum [facility] Reason for Treatment: MDD, OCD Does patient have an ACCT team?: No Does patient have Intensive In-House Services?  : No Does patient have [facility] services? : No Does patient have P4CC services?: No  Alcohol Screening: 1. How often do you have a drink containing alcohol?: Never 2. How many drinks containing alcohol do you have on a typical day when you are drinking?: 1 or 2 3. How often do you have six or more drinks on one occasion?: Never AUDIT-C Score: 0 9. Have you or someone else been injured as a result of your drinking?: No 10. Has a relative or friend or a doctor or another health worker been concerned about your drinking or suggested you cut down?: No Alcohol Use Disorder Identification Test Final Score (AUDIT): 0 Alcohol Brief Interventions/Follow-up: AUDIT Score <7 follow-up not indicated Substance Abuse History in the last 12 months:  No. Consequences of Substance Abuse: NA Previous Psychotropic Medications: Yes  Psychological Evaluations: Yes  Past Medical History:  Past Medical History:  Diagnosis Date  . Anxiety   . Depression   . History of chicken pox   . OCD (obsessive compulsive disorder)    History reviewed. No pertinent surgical history. Family History:  Family History  Problem Relation Age of Onset  . Alcohol abuse  Father   . Cancer Father   . Depression Father   . Mental illness Father   . Mental illness Brother   . Arthritis Maternal Grandmother   . Depression Maternal Grandmother   . Diabetes Maternal Grandmother   . Hearing loss Maternal Grandmother   . Hypertension Maternal Grandmother   . Alcohol abuse Maternal Grandfather   . Cancer Maternal Grandfather   . Early death Paternal Grandmother   . Alcohol abuse Paternal Grandfather   . Cancer Paternal Grandfather    Family Psychiatric History: no pertinent history Tobacco Screening: Have you used any form of tobacco in the last 30 days? (Cigarettes, Smokeless Tobacco, Cigars, and/or Pipes): No Social History:  Social History   Substance and Sexual Activity  Alcohol Use Not Currently     Social History   Substance and Sexual Activity  Drug Use Never    Additional Social History: Marital status: Married    Pain Medications: Please see MAR Prescriptions: Please see MAR Over the Counter: Please see MAR History of alcohol / drug use?: No history of alcohol / drug abuse Longest period of sobriety (when/how long): Pt denies SA  Allergies:  No Known Allergies Lab Results: No results found for this or any previous visit (from the past 48 hour(s)).  Blood Alcohol level:  No results found for: [facility]  Metabolic Disorder Labs:  No results found for: HGBA1C, MPG No results found for: PROLACTIN No results found for: CHOL, TRIG, HDL, CHOLHDL, VLDL, LDLCALC  Current Medications: Current Facility-Administered Medications  Medication Dose Route Frequency Provider Last Rate Last Dose  . acetaminophen (TYLENOL) tablet 650 mg  650 mg Oral Q6H PRN Jackelyn PolingBerry, Nathaniel Wakeley A, NP      . alum & mag hydroxide-simeth (MAALOX/MYLANTA) 200-200-20 MG/5ML suspension 30 mL  30 mL Oral Q4H PRN Nira ConnBerry, Karletta Millay A, NP      . magnesium hydroxide (MILK OF MAGNESIA) suspension 30 mL  30 mL Oral Daily PRN Nira ConnBerry, Tysen Roesler A, NP      . sertraline (ZOLOFT)  tablet 200 mg  200 mg Oral QHS Nira ConnBerry, Kimberl Vig A, NP   200 mg at 07/05/18 2156   PTA Medications: Medications Prior to Admission  Medication Sig Dispense Refill Last Dose  . ALPRAZolam (XANAX) 0.5 MG tablet Take 1 tablet (0.5 mg total) by mouth 2 (two) times daily as needed for anxiety. (Patient not taking: Reported on 03/22/2018) 30 tablet 0 Not Taking  . fluticasone (FLONASE) 50 MCG/ACT nasal spray Place 2 sprays into both nostrils daily. 16 g 6   . sertraline (ZOLOFT) 100 MG tablet Take 1 tablet (100 mg total) by mouth daily. 90 tablet 1 Taking    Musculoskeletal: Strength & Muscle Tone: within normal limits Gait & Station: normal Patient leans: Front  Psychiatric Specialty Exam: Physical Exam  Constitutional: She is oriented to person, place, and time. She appears well-developed and well-nourished. No distress.  HENT:  Head: Normocephalic and atraumatic.  Right Ear: External ear normal.  Left Ear: External ear normal.  Eyes: Pupils are equal, round, and reactive to light. Conjunctivae are normal. Right eye exhibits no discharge. Left eye exhibits no discharge. No scleral icterus.  Respiratory: Effort normal. No respiratory distress.  Musculoskeletal: Normal range of motion.  Neurological: She is alert and oriented to person, place, and time.  Skin: She is not diaphoretic.  Psychiatric: Her mood appears anxious. Her speech is not delayed and not tangential. She is not withdrawn and not actively hallucinating. Thought content is not paranoid and not delusional. She exhibits a depressed mood. She expresses suicidal ideation. She expresses no homicidal ideation. She expresses suicidal plans.    Review of Systems  Constitutional: Negative for chills, diaphoresis, fever, malaise/fatigue and weight loss.  Respiratory: Negative for cough and shortness of breath.   Cardiovascular: Negative for chest pain.  Gastrointestinal: Negative for diarrhea, nausea and vomiting.  Psychiatric/Behavioral:  Positive for depression and suicidal ideas. Negative for hallucinations, memory loss and substance abuse. The patient is nervous/anxious. The patient does not have insomnia.   All other systems reviewed and are negative.   Blood pressure 127/86, pulse 98, temperature 98.4 F (36.9 C), temperature source Oral, resp. rate 20.There is no height or weight on file to calculate BMI.  General Appearance: Casual and Fairly Groomed  Eye Contact:  Minimal  Speech:  Clear and Coherent and Normal Rate  Volume:  Decreased  Mood:  Anxious, Depressed, Hopeless and Worthless  Affect:  Congruent, Depressed and Tearful  Thought Process:  Coherent, Goal Directed and Descriptions of Associations: Intact  Orientation:  Full (Time, Place, and Person)  Thought Content:  Logical and Hallucinations: None  Suicidal Thoughts:  Yes.  with intent/plan  Homicidal Thoughts:  No  Memory:  Immediate;   Good Recent;   Fair  Judgement:  Impaired  Insight:  Lacking  Psychomotor Activity:  Normal  Concentration:  Concentration: Fair and Attention Span: Fair  Recall:  Good  Fund of Knowledge:  Good  Language:  Good  Akathisia:  Negative  Handed:  Right  AIMS (if indicated):     Assets:  Communication Skills Desire for Improvement Financial Resources/Insurance Housing Leisure Time Physical Health  ADL's:  Intact  Cognition:  WNL  Sleep:         Treatment Plan Summary: Daily contact with patient to assess and evaluate symptoms and progress in treatment and Medication management  Observation Level/Precautions:  15 minute checks Laboratory:  CBC Chemistry Profile HbAIC HCG UDS Vitamin B-12 TSH Psychotherapy:  Individual Medications:   Patient reports that she take sertraline 200 mg QHS      Discussed risk verus benefits of continuing during pregnancy. Consents to continuing sertraline at this time. States that she thinks she took it during her last pregnancy without any issues. Will decrease to 200 mg  QHS.  Consultations:  As needed Discharge Concerns:   Estimated LOS: At this time patient meets inpatient criteria; however she is only in agreement to stay overnight with reevaluation tomorrow.  Other:      Rozetta Nunnery, NP 6/9/202012:23 AM

## 2018-07-06 NOTE — Tx Team (Signed)
Initial Treatment Plan 07/06/2018 9:59 AM [name] JYN:[id number]    PATIENT STRESSORS: Marital or family conflict   PATIENT STRENGTHS: Ability for insight Communication skills General fund of knowledge   PATIENT IDENTIFIED PROBLEMS: "I was feeling suicidal"  "I don't feel supported at home"  "I am pregnant and I don't know who the father of my child is"                 DISCHARGE CRITERIA:  Reduction of life-threatening or endangering symptoms to within safe limits Verbal commitment to aftercare and medication compliance  PRELIMINARY DISCHARGE PLAN: Return to previous living arrangement  PATIENT/FAMILY INVOLVEMENT: This treatment plan has been presented to and reviewed with the patient, Gina Gillespie, and/or family member, The patient and family have been given the opportunity to ask questions and make suggestions.  Clarita Crane, RN 07/06/2018, 9:59 AM

## 2018-07-07 MED ORDER — ENBRACE HR PO CAPS: 1.0000 | ORAL_CAPSULE | Freq: Every day | ORAL | 2 refills | Status: DC

## 2018-07-07 NOTE — Discharge Summary (Signed)
Physician Discharge Summary Note  Patient:  Gina Gillespie is an 28 y.o., female MRN:  [id number] DOB:  1990/04/08 Patient phone:  [phone] (home)  Patient address[address]   [address] [place] [phone],  Total Time spent with patient: 45 minutes  Date of Admission:  07/05/2018 Date of Discharge: 07/07/2018  Reason for Admission:    History of Present Illness:  Patient is a 28 year old married female, presented to ED voluntarily on 6/8, due to depression, suicidal ideations, with thoughts of crashing car. Reports significant stressors, including marital stressors- reports she had recently told him about an extramarital affair, and she is unsure if marriage will survive. She also recently found out she is pregnant ( currently 5 weeks) .  States that her husband has been distant and not wanting to talk to her since then. States that prior to these stressors she was " doing OK" and was not feeling particularly depressed . Over the last several days has been feeling depressed , ruminative about her stressors. Endorses neuro-vegetative symptoms as below.  Of note, reports she has been on Zoloft for several years for management of OCD .     Principal Problem: Severe recurrent major depression without psychotic features [facility](HCC) Discharge Diagnoses: Principal Problem:   Severe recurrent major depression without psychotic features (HCC) Active Problems:   MDD (major depressive disorder), single episode, severe , no psychosis (HCC)   Past Psychiatric History: see eval  Past Medical History:  Past Medical History:  Diagnosis Date  . Anxiety   . Depression   . History of chicken pox   . OCD (obsessive compulsive disorder)    History reviewed. No pertinent surgical history. Family History:  Family History  Problem Relation Age of Onset  . Alcohol abuse Father   . Cancer Father   . Depression Father   . Mental illness Father   . Mental illness Brother   . Arthritis Maternal  Grandmother   . Depression Maternal Grandmother   . Diabetes Maternal Grandmother   . Hearing loss Maternal Grandmother   . Hypertension Maternal Grandmother   . Alcohol abuse Maternal Grandfather   . Cancer Maternal Grandfather   . Early death Paternal Grandmother   . Alcohol abuse Paternal Grandfather   . Cancer Paternal Grandfather    Family Psychiatric  History: no new data Social History:  Social History   Substance and Sexual Activity  Alcohol Use Not Currently     Social History   Substance and Sexual Activity  Drug Use Never    Social History   Socioeconomic History  . Marital status: Married    Spouse name: Not on file  . Number of children: Not on file  . Years of education: Not on file  . Highest education level: Not on file  Occupational History  . Not on file  Social Needs  . Financial resource strain: Not on file  . Food insecurity:    Worry: Not on file    Inability: Not on file  . Transportation needs:    Medical: Not on file    Non-medical: Not on file  Tobacco Use  . Smoking status: Never Smoker  . Smokeless tobacco: Never Used  Substance and Sexual Activity  . Alcohol use: Not Currently  . Drug use: Never  . Sexual activity: Yes    Partners: Male  Lifestyle  . Physical activity:    Days per week: Not on file    Minutes per session: Not on file  .  Stress: Not on file  Relationships  . Social connections:    Talks on phone: Not on file    Gets together: Not on file    Attends religious service: Not on file    Active member of club or organization: Not on file    Attends meetings of clubs or organizations: Not on file    Relationship status: Not on file  Other Topics Concern  . Not on file  Social History Narrative  . Not on file    Hospital Course:   Patient was always cordial and compliant and displayed no dangerous behaviors here.  By the date of the 10th she reported no suicidal thoughts plans or intent and was requesting  discharge home.  I was asked to see her given my experience of treating depression during pregnancy, research on B vitamins/folates and prenatal medications for depression so forth.  So we had a discussion about possible prenatal treatments for depression and the patient was informed about various options and also about antioxidants and in particular n-acetyl cystine that is used for obsessive-compulsive disorder She stated being here was making her more depressed rather than improving her condition at this point and she was not suicidal - could contract fully - no thoughts plans or intent, reported a reduction in OCD and had outpatient follow-up with her clinician already arranged - has follow-up arranged and is requesting discharge home no acute dangerousness  Physical Findings: AIMS: Facial and Oral Movements Muscles of Facial Expression: None, normal Lips and Perioral Area: None, normal Jaw: None, normal Tongue: None, normal,Extremity Movements Upper (arms, wrists, hands, fingers): None, normal Lower (legs, knees, ankles, toes): None, normal, Trunk Movements Neck, shoulders, hips: None, normal, Overall Severity Severity of abnormal movements (highest score from questions above): None, normal Incapacitation due to abnormal movements: None, normal Patient's awareness of abnormal movements (rate only patient's report): No Awareness, Dental Status Current problems with teeth and/or dentures?: No Does patient usually wear dentures?: No  CIWA:    COWS:    Musculoskeletal: Strength & Muscle Tone: within normal limits Gait & Station: normal Patient leans: N/A  Psychiatric Specialty Exam: ROS  Blood pressure 101/75, pulse 88, temperature 98 F (36.7 C), temperature source Oral, resp. rate 18, height 5\' 5"  (1.651 m), weight 66.7 kg, SpO2 98 %.Body mass index is 24.46 kg/m.  General Appearance: Casual  Eye Contact:: good  Speech:  Clear and Coherent409  Volume:  Normal  Mood:  Euthymic   Affect:  Appropriate  Thought Process:  Coherent and Descriptions of Associations: Intact  Orientation:  Full (Time, Place, and Person)  Thought Content:  Logical  Suicidal Thoughts:  No  Homicidal Thoughts:  No  Memory:  Immediate;   Good  Judgement:  Good  Insight:  Good  Psychomotor Activity:  Normal  Concentration:  Good  Recall:  Good  Fund of Knowledge:Good  Language: Good  Akathisia:  Negative  Handed:  Right  AIMS (if indicated):     Assets:  Communication Skills Desire for Improvement Housing Intimacy Leisure Time Physical Health Resilience Social Support Talents/Skills Transportation  Sleep:  Number of Hours: 6.5  Cognition: WNL  ADL's:  Intact       Have you used any form of tobacco in the last 30 days? (Cigarettes, Smokeless Tobacco, Cigars, and/or Pipes): No  Has this patient used any form of tobacco in the last 30 days? (Cigarettes, Smokeless Tobacco, Cigars, and/or Pipes) Yes, No  Blood Alcohol level:  No results found for:  [facility]  Metabolic Disorder Labs:  Lab Results  Component Value Date   HGBA1C 5.2 07/06/2018   MPG 102.54 07/06/2018   No results found for: PROLACTIN Lab Results  Component Value Date   CHOL 164 07/06/2018   TRIG 38 07/06/2018   HDL 55 07/06/2018   CHOLHDL 3.0 07/06/2018   VLDL 8 07/06/2018   LDLCALC 101 (H) 07/06/2018    See Psychiatric Specialty Exam and Suicide Risk Assessment completed by Attending Physician prior to discharge.  Discharge destination:  Home  Is patient on multiple antipsychotic therapies at discharge:  No   Has Patient had three or more failed trials of antipsychotic monotherapy by history:  No  Recommended Plan for Multiple Antipsychotic Therapies: NA  Discharge Instructions    Diet - low sodium heart healthy   Complete by:  As directed    Increase activity slowly   Complete by:  As directed      Allergies as of 07/07/2018   No Known Allergies     Medication List    STOP taking  these medications   ALPRAZolam 0.5 MG tablet Commonly known as:  Xanax   fluticasone 50 MCG/ACT nasal spray Commonly known as:  FLONASE   sertraline 100 MG tablet Commonly known as:  ZOLOFT     TAKE these medications     Indication  EnBrace HR Caps Take 1 capsule by mouth daily.  Indication:  21-Hydroxylase Deficiency      Follow-up Information    [facility] Follow up on 09/06/2018.   Specialty:  Behavioral Health Why:  Medication management with Dr. Clovis Pu is Monday, 8/10 at 9:00a.  You are on the cancelation list. Office will contact you with if an earlier appointment becomes available.  Contact information: [address] [place] [address] [facility] Follow up on 07/14/2018.   Specialty:  Behavioral Health Why:  Virtual therapy appointment with Rollene Fare is Wednesday, 6/17 at 1:00p.  Rollene Fare will email you a link with the WebEx information for the appointment.  Contact information: [place] [phone]          SignedJohnn Hai, MD 07/07/2018, 9:42 AM

## 2018-07-07 NOTE — BHH Suicide Risk Assessment (Signed)
[facility] Discharge Suicide Risk Assessment   Principal Problem: Severe recurrent major depression without psychotic features [facility]) Discharge Diagnoses: Principal Problem:   Severe recurrent major depression without psychotic features ([place]) Active Problems:   MDD (major depressive disorder), single episode, severe , no psychosis ([place])   Total Time spent with patient: 45 minutes  Musculoskeletal: Strength & Muscle Tone: within normal limits Gait & Station: normal Patient leans: N/A  Psychiatric Specialty Exam: ROS  Blood pressure 101/75, pulse 88, temperature 98 F (36.7 C), temperature source Oral, resp. rate 18, height 5\' 5"  (1.651 m), weight 66.7 kg, SpO2 98 %.Body mass index is 24.46 kg/m.  General Appearance: Casual  Eye Contact:: good  Speech:  Clear and Coherent409  Volume:  Normal  Mood:  Euthymic  Affect:  Appropriate  Thought Process:  Coherent and Descriptions of Associations: Intact  Orientation:  Full (Time, Place, and Person)  Thought Content:  Logical  Suicidal Thoughts:  No  Homicidal Thoughts:  No  Memory:  Immediate;   Good  Judgement:  Good  Insight:  Good  Psychomotor Activity:  Normal  Concentration:  Good  Recall:  Good  Fund of Knowledge:Good  Language: Good  Akathisia:  Negative  Handed:  Right  AIMS (if indicated):     Assets:  Communication Skills Desire for Improvement Housing Intimacy Leisure Time Physical Health Resilience Social Support Talents/Skills Transportation  Sleep:  Number of Hours: 6.5  Cognition: WNL  ADL's:  Intact   Mental Status Per Nursing Assessment::   On Admission:  Suicidal ideation indicated by patient  Demographic Factors:  NA  Loss Factors: NA  Historical Factors: NA  Risk Reduction Factors:   Pregnancy, Responsible for children under 79 years of age, Sense of responsibility to family, Religious beliefs about death, Living with another person, especially a relative, Positive social support, Positive  therapeutic relationship and Positive coping skills or problem solving skills  Continued Clinical Symptoms:  Obsessive-Compulsive Disorder  Cognitive Features That Contribute To Risk:  None    Suicide Risk:  Minimal: No identifiable suicidal ideation.  Patients presenting with no risk factors but with morbid ruminations; may be classified as minimal risk based on the severity of the depressive symptoms  Follow-up Information    [facility] Follow up on 09/06/2018.   Specialty:  Behavioral Health Why:  Medication management with Dr. Clovis Pu is Monday, 8/10 at 9:00a.  You are on the cancelation list. Office will contact you with if an earlier appointment becomes available.  Contact information: [address] [place] [address] [facility] Follow up on 07/14/2018.   Specialty:  Behavioral Health Why:  Virtual therapy appointment with Rollene Fare is Wednesday, 6/17 at 1:00p.  Rollene Fare will email you a link with the WebEx information for the appointment.  Contact information: [place] [phone]          Plan Of Care/Follow-up recommendations:  Activity:  full  Belvia Gotschall, MD 07/07/2018, 9:39 AM

## 2018-07-07 NOTE — Progress Notes (Signed)
Discharge note: Patient reviewed discharge paperwork with RN including prescriptions, follow up appointments, and lab work. Patient given the opportunity to ask questions. All concerns were addressed. All belongings were returned to patient. Denied SI/HI/AVH. Patient thanked staff for their care while at the hospital.  Patient was discharged to lobby- car is in the parking lot.

## 2018-07-07 NOTE — BHH Suicide Risk Assessment (Signed)
[place] INPATIENT:  Family/Significant Other Suicide Prevention Education  Suicide Prevention Education:  Contact Attempts: with mother in law, Anora Schwenke [phone]) has been identified by the patient as the family member/significant other with whom the patient will be residing, and identified as the person(s) who will aid the patient in the event of a mental health crisis.  With written consent from the patient, two attempts were made to provide suicide prevention education, prior to and/or following the patient's discharge.  We were unsuccessful in providing suicide prevention education.  A suicide education pamphlet was given to the patient to share with family/significant other.  Date and time of first attempt:07/07/2018 / 10:53am  [name] 07/07/2018, 10:52 AM

## 2018-07-07 NOTE — Progress Notes (Signed)
  [facility] Adult Case Management Discharge Plan :  Will you be returning to the same living situation after discharge:  Yes,  patient reports she is returning home to speak with her spouse. At discharge, do you have transportation home?: Yes,  patient's car is in [facility] parking lot  Do you have the ability to pay for your medications: Yes,  BCBS  Release of information consent forms completed and in the chart;  Patient's signature needed at discharge.  Patient to Follow up at: Follow-up Information    [facility] Follow up on 09/06/2018.   Specialty:  Behavioral Health Why:  Medication management with Gina Gillespie is Monday, 8/10 at 9:00a.  You are on the cancelation list. Office will contact you with if an earlier appointment becomes available.  Contact information: [address] [place] [address] [facility] Follow up on 07/14/2018.   Specialty:  Behavioral Health Why:  Virtual therapy appointment with Rollene Fare is Wednesday, 6/17 at 1:00p.  Rollene Fare will email you a link with the WebEx information for the appointment.  Contact information: [place] [facility] Follow up on 07/08/2018.   Specialty:  Behavioral Health Why:  Your CCA appointment is Thursday, 6/11 at 12:00p. Appointments will be held over WebEx and the providers will contact you.  Contact information: [place] [id number] [place] [phone]          Next level of care provider has access to [place] and Suicide Prevention discussed: Yes,  with the patient's mother in law  Have you used any form of tobacco in the last 30 days? (Cigarettes, Smokeless Tobacco, Cigars, and/or Pipes): No  Has patient been referred to the Quitline?: N/A patient is  not a smoker  Patient has been referred for addiction treatment: N/A  Gina Gillespie, [place] 07/07/2018, 3:16 PM

## 2018-07-07 NOTE — Tx Team (Signed)
Interdisciplinary Treatment and Diagnostic Plan Update  07/07/2018 Time of Session:  Gina Gillespie MRN: [id number]  Principal Diagnosis: Severe recurrent major depression without psychotic features [facility])  Secondary Diagnoses: Principal Problem:   Severe recurrent major depression without psychotic features ([place]) Active Problems:   MDD (major depressive disorder), single episode, severe , no psychosis ([place])   Current Medications:  Current Facility-Administered Medications  Medication Dose Route Frequency Provider Last Rate Last Dose  . acetaminophen (TYLENOL) tablet 650 mg  650 mg Oral Q6H PRN Rankin, Shuvon B, NP      . alum & mag hydroxide-simeth (MAALOX/MYLANTA) 200-200-20 MG/5ML suspension 30 mL  30 mL Oral Q4H PRN Rankin, Shuvon B, NP      . diphenhydrAMINE (BENADRYL) capsule 50 mg  50 mg Oral QHS PRN Lindon Romp A, NP      . magnesium hydroxide (MILK OF MAGNESIA) suspension 30 mL  30 mL Oral Daily PRN Rankin, Shuvon B, NP      . sertraline (ZOLOFT) tablet 200 mg  200 mg Oral QHS Rankin, Shuvon B, NP   200 mg at 07/06/18 2209   PTA Medications: Medications Prior to Admission  Medication Sig Dispense Refill Last Dose  . fluticasone (FLONASE) 50 MCG/ACT nasal spray Place 2 sprays into both nostrils daily. 16 g 6   . sertraline (ZOLOFT) 100 MG tablet Take 1 tablet (100 mg total) by mouth daily. (Patient taking differently: Take 200 mg by mouth daily. ) 90 tablet 1 Taking  . ALPRAZolam (XANAX) 0.5 MG tablet Take 1 tablet (0.5 mg total) by mouth 2 (two) times daily as needed for anxiety. (Patient not taking: Reported on 03/22/2018) 30 tablet 0 Not Taking at Unknown time    Patient Stressors: Marital or family conflict  Patient Strengths: Ability for insight Curator fund of knowledge  Treatment Modalities: Medication Management, Group therapy, Case management,  1 to 1 session with clinician, Psychoeducation, Recreational therapy.   Physician Treatment Plan  for Primary Diagnosis: Severe recurrent major depression without psychotic features ([place]) Long Term Goal(s): Improvement in symptoms so as ready for discharge Improvement in symptoms so as ready for discharge   Short Term Goals: Ability to identify changes in lifestyle to reduce recurrence of condition will improve Ability to verbalize feelings will improve Ability to disclose and discuss suicidal ideas Ability to demonstrate self-control will improve Ability to identify and develop effective coping behaviors will improve Ability to maintain clinical measurements within normal limits will improve Ability to identify changes in lifestyle to reduce recurrence of condition will improve Ability to verbalize feelings will improve Ability to disclose and discuss suicidal ideas Ability to demonstrate self-control will improve Ability to identify and develop effective coping behaviors will improve Ability to maintain clinical measurements within normal limits will improve  Medication Management: Evaluate patient's response, side effects, and tolerance of medication regimen.  Therapeutic Interventions: 1 to 1 sessions, Unit Group sessions and Medication administration.  Evaluation of Outcomes: Not Met  Physician Treatment Plan for Secondary Diagnosis: Principal Problem:   Severe recurrent major depression without psychotic features ([place]) Active Problems:   MDD (major depressive disorder), single episode, severe , no psychosis ([place])  Long Term Goal(s): Improvement in symptoms so as ready for discharge Improvement in symptoms so as ready for discharge   Short Term Goals: Ability to identify changes in lifestyle to reduce recurrence of condition will improve Ability to verbalize feelings will improve Ability to disclose and discuss suicidal ideas Ability to demonstrate self-control will improve Ability  to identify and develop effective coping behaviors will improve Ability to maintain  clinical measurements within normal limits will improve Ability to identify changes in lifestyle to reduce recurrence of condition will improve Ability to verbalize feelings will improve Ability to disclose and discuss suicidal ideas Ability to demonstrate self-control will improve Ability to identify and develop effective coping behaviors will improve Ability to maintain clinical measurements within normal limits will improve     Medication Management: Evaluate patient's response, side effects, and tolerance of medication regimen.  Therapeutic Interventions: 1 to 1 sessions, Unit Group sessions and Medication administration.  Evaluation of Outcomes: Not Met   RN Treatment Plan for Primary Diagnosis: Severe recurrent major depression without psychotic features ([place]) Long Term Goal(s): Knowledge of disease and therapeutic regimen to maintain health will improve  Short Term Goals: Ability to participate in decision making will improve, Ability to verbalize feelings will improve, Ability to disclose and discuss suicidal ideas and Ability to identify and develop effective coping behaviors will improve  Medication Management: RN will administer medications as ordered by provider, will assess and evaluate patient's response and provide education to patient for prescribed medication. RN will report any adverse and/or side effects to prescribing provider.  Therapeutic Interventions: 1 on 1 counseling sessions, Psychoeducation, Medication administration, Evaluate responses to treatment, Monitor vital signs and CBGs as ordered, Perform/monitor CIWA, COWS, AIMS and Fall Risk screenings as ordered, Perform wound care treatments as ordered.  Evaluation of Outcomes: Not Met   LCSW Treatment Plan for Primary Diagnosis: Severe recurrent major depression without psychotic features ([place]) Long Term Goal(s): Safe transition to appropriate next level of care at discharge, Engage patient in therapeutic group  addressing interpersonal concerns.  Short Term Goals: Engage patient in aftercare planning with referrals and resources  Therapeutic Interventions: Assess for all discharge needs, 1 to 1 time with Social worker, Explore available resources and support systems, Assess for adequacy in community support network, Educate family and significant other(s) on suicide prevention, Complete Psychosocial Assessment, Interpersonal group therapy.  Evaluation of Outcomes: Progressing   Progress in Treatment: Attending groups: No. New to unit  Participating in groups: No. Taking medication as prescribed: Yes. Toleration medication: Yes. Family/Significant other contact made: No, will contact:  the patient's mother in law Patient understands diagnosis: Yes. Discussing patient identified problems/goals with staff: Yes. Medical problems stabilized or resolved: Yes. Denies suicidal/homicidal ideation: Yes. Issues/concerns per patient self-inventory: No. Other:   New problem(s) identified:   New Short Term/Long Term Goal(s):medication stabilization, elimination of SI thoughts, development of comprehensive mental wellness plan.    Patient Goals:    Discharge Plan or Barriers: Patient recently admitted. CSW will continue to follow for any appropriate referrals and possible discharge planning.   Reason for Continuation of Hospitalization: Anxiety Depression Medication stabilization Suicidal ideation  Estimated Length of Stay: 3-5 days   Attendees: Patient: 07/07/2018 8:31 AM  Physician: Dr. Neita Garnet, MD 07/07/2018 8:31 AM  Nursing: Gina Flock.Jacinto Reap, RN 07/07/2018 8:31 AM  RN Care Manager: 07/07/2018 8:31 AM  Social Worker: Gina Gillespie, [place] 07/07/2018 8:31 AM  Recreational Therapist:  07/07/2018 8:31 AM  Other:  07/07/2018 8:31 AM  Other:  07/07/2018 8:31 AM  Other: 07/07/2018 8:31 AM    Scribe for Treatment Team: Gina Gillespie, [place] 07/07/2018 8:31 AM

## 2018-07-07 NOTE — Plan of Care (Addendum)
Patient self inventory- patient slept fair last night, appetite is good, energy level normal, concentration good. Depression, hopelessness, and anxiety rated 3, 1, 1 out of 10. Patient's goal is "wrapping up my time with the social worker and going home." Denies SI HI AVH. Safety maintained with 15 minute checks as well as environmental checks. Will continue to execute discharge process.  Problem: Education: Goal: Knowledge of [place] [facility] Education information/materials will improve Outcome: Progressing Goal: Emotional status will improve Outcome: Progressing Goal: Mental status will improve Outcome: Progressing Goal: Verbalization of understanding the information provided will improve Outcome: Progressing

## 2018-07-07 NOTE — BHH Suicide Risk Assessment (Addendum)
[place] INPATIENT:  Family/Significant Other Suicide Prevention Education  Suicide Prevention Education:  Education Completed; Lalia Loudon, mother in law [phone]) has been identified by the patient as the family member/significant other with whom the patient will be residing, and identified as the person(s) who will aid the patient in the event of a mental health crisis (suicidal ideations/suicide attempt).  With written consent from the patient, the family member/significant other has been provided the following suicide prevention education, prior to the and/or following the discharge of the patient.  The suicide prevention education provided includes the following:  Suicide risk factors  Suicide prevention and interventions  National Suicide Hotline telephone number  [facility] assessment telephone number  [facility] Emergency Assistance [place] and/or Residential Mobile Crisis Unit telephone number  Request made of family/significant other to:  Remove weapons (e.g., guns, rifles, knives), all items previously/currently identified as safety concern.    Remove drugs/medications (over-the-counter, prescriptions, illicit drugs), all items previously/currently identified as a safety concern.  The family member/significant other verbalizes understanding of the suicide prevention education information provided.  The family member/significant other agrees to remove the items of safety concern listed above.  Patient's mother-in-law reports that she does not have any questions or concerns regarding the patient's discharge, however she wanted to ensure that the patient had follow up appointments in place. She reports she and her husband plan to visit the patient and her spouse this weekend. CSW will continue to follow for a safe discharge.   [name] 07/07/2018, 11:42 AM

## 2018-07-07 NOTE — BHH Group Notes (Signed)
Occupational Therapy Group Note  Date:  07/07/2018 Time:  11:26 AM  Group Topic/Focus:  Self Esteem Action Plan:   The focus of this group is to help patients create a plan to continue to build self-esteem after discharge.  Participation Level:  Active  Participation Quality:  Appropriate and Drowsy  Affect:  Depressed and Flat  Cognitive:  Appropriate  Insight: Improving  Engagement in Group:  Engaged  Modes of Intervention:  Activity, Discussion, Education and Socialization  Additional Comments:    S: "Having a hobby helps increase my self esteem"  O:Education given on self esteem and how it relates to daily experiences. Pt asked to give definition of self esteem, and current rating of self esteem. Positive and negative contributors of self esteem to be brainstormed within group in relation to personal experiences. Positive thinking activity then completed for pt to identify several positive traits about themselves. Pt asked to share at end of session.   A: Pt presents with flat affect, drowsy, but engaged and participatory throughout session. Pt engaged in discussion, sharing how hobbies help increase her self esteem. Pt completed A-Z activity, willing to share and noted slight improvement in affect by end of session.  P: OT group will be x1 per week while pt inpatient.  Zenovia Jarred, MSOT, OTR/L Behavioral Health OT/ Acute Relief OT PHP Office: [place] 07/07/2018, 11:26 AM

## 2018-07-08 ENCOUNTER — Other Ambulatory Visit: Payer: Self-pay

## 2018-07-08 ENCOUNTER — Other Ambulatory Visit (HOSPITAL_COMMUNITY): Payer: PRIVATE HEALTH INSURANCE | Attending: Psychiatry | Admitting: Licensed Clinical Social Worker

## 2018-07-08 DIAGNOSIS — F332 Major depressive disorder, recurrent severe without psychotic features: Secondary | ICD-10-CM | POA: Insufficient documentation

## 2018-07-08 DIAGNOSIS — F411 Generalized anxiety disorder: Secondary | ICD-10-CM | POA: Insufficient documentation

## 2018-07-12 NOTE — Telephone Encounter (Signed)
He was recently hospitalized.  Make sure she is on the 30-minute cancellation list.

## 2018-07-14 ENCOUNTER — Ambulatory Visit (HOSPITAL_COMMUNITY): Payer: PRIVATE HEALTH INSURANCE | Admitting: Licensed Clinical Social Worker

## 2018-07-15 ENCOUNTER — Telehealth (HOSPITAL_COMMUNITY): Payer: Self-pay | Admitting: Professional

## 2018-07-15 ENCOUNTER — Ambulatory Visit: Payer: PRIVATE HEALTH INSURANCE | Admitting: Family Medicine

## 2018-07-15 ENCOUNTER — Telehealth (HOSPITAL_COMMUNITY): Payer: Self-pay | Admitting: Licensed Clinical Social Worker

## 2018-07-16 ENCOUNTER — Ambulatory Visit (INDEPENDENT_AMBULATORY_CARE_PROVIDER_SITE_OTHER): Payer: PRIVATE HEALTH INSURANCE | Admitting: Family Medicine

## 2018-07-16 ENCOUNTER — Other Ambulatory Visit: Payer: Self-pay

## 2018-07-16 ENCOUNTER — Other Ambulatory Visit (HOSPITAL_COMMUNITY)
Admission: RE | Admit: 2018-07-16 | Discharge: 2018-07-16 | Disposition: A | Discharge status: Home / Self Care | Payer: PRIVATE HEALTH INSURANCE | Source: Ambulatory Visit | Attending: Family Medicine | Admitting: Family Medicine

## 2018-07-16 ENCOUNTER — Encounter: Payer: Self-pay | Admitting: Family Medicine

## 2018-07-16 VITALS — BP 122/60 | HR 71 | Temp 98.2°F | Resp 16 | Ht 66.0 in | Wt 153.8 lb

## 2018-07-16 DIAGNOSIS — Z202 Contact with and (suspected) exposure to infections with a predominantly sexual mode of transmission: Secondary | ICD-10-CM | POA: Insufficient documentation

## 2018-07-16 DIAGNOSIS — Z630 Problems in relationship with spouse or partner: Secondary | ICD-10-CM

## 2018-07-16 DIAGNOSIS — Z3490 Encounter for supervision of normal pregnancy, unspecified, unspecified trimester: Secondary | ICD-10-CM

## 2018-07-16 NOTE — Progress Notes (Signed)
Subjective  CC:  Chief Complaint  Patient presents with  . STD testing    She recently had unprotected sex, she is [redacted] wks pregnant.. Denies any symptoms.. Has questions about medical sexual dysfunction   Same day acute visit; PCP not available. New pt to me. Chart reviewed.   HPI: Gina Gillespie is a 28 y.o. female who presents to the office today to address the problems listed above in the chief complaint.  Pleasant 28 yo married mother of 2 children presents about [redacted] wk EGA by ultrasound last week for STD screening. Had extramarital affair, unprotected intercourse once. Unknown risk status of her partner but no sxs. Pt is asymptomatic for stds. Recent BH admission for SI and depression; she reports she is doing well from that standpoint. She and her husband are set up for marital counseling. She believes he is the father of the baby. She is considering which ob to go to.   She has questions about sexual health. Her husband is concerned she has a medical sexual dysfunction problem. However, pt reports married early, had children quickly, and has been on SSRI's for many years. She has been orgasmic intermittently; she was orgasmic with her recent partner.    Assessment  1. STD exposure   2. Early stage of pregnancy   3. Marital stress      Plan   Std screen due to HR sex behavior:  Educated on prevention and testing ordered.   Early pregnancy: counseling done: to see OB by week 12. pnv   Marital stressors and sexual dysfunction: counseling done. Sexual dysfunction likely multifactorial and worsened by SSRI. rec counseling to start.   Follow up: prn  Visit date not found  Orders Placed This Encounter  Procedures  . RPR  . HIV Antibody (routine testing w rflx)  . Hepatitis B core antibody, total   No orders of the defined types were placed in this encounter.     I reviewed the patients updated PMH, FH, and SocHx.    Patient Active Problem List   Diagnosis Date Noted  .  MDD (major depressive disorder), single episode, severe , no psychosis (HCC) 07/06/2018  . Severe recurrent major depression without psychotic features (HCC) 07/05/2018  . GAD (generalized anxiety disorder) 03/22/2018  . Bilateral leg pain 08/09/2017   No outpatient medications have been marked as taking for the 07/16/18 encounter (Office Visit) with Willow OraAndy,  L, MD.    Allergies: Patient has No Known Allergies. Family History: Patient family history includes Alcohol abuse in her father, maternal grandfather, and paternal grandfather; Arthritis in her maternal grandmother; Cancer in her father, maternal grandfather, and paternal grandfather; Depression in her father and maternal grandmother; Diabetes in her maternal grandmother; Early death in her paternal grandmother; Hearing loss in her maternal grandmother; Hypertension in her maternal grandmother; Mental illness in her brother and father. Social History:  Patient  reports that she has never smoked. She has never used smokeless tobacco. She reports previous alcohol use. She reports that she does not use drugs.  Review of Systems: Constitutional: Negative for fever malaise or anorexia Cardiovascular: negative for chest pain Respiratory: negative for SOB or persistent cough Gastrointestinal: negative for abdominal pain  Objective  Vitals: BP 122/60   Pulse 71   Temp 98.2 F (36.8 C) (Oral)   Resp 16   Ht 5\' 6"  (1.676 m)   Wt 153 lb 12.8 oz (69.8 kg)   LMP 03/13/2018 (Exact Date)   SpO2 98%  BMI 24.82 kg/m  General: no acute distress , A&Ox3 Psych:appropriate, fair insight. Normal affect HEENT: PEERL, conjunctiva normal, Oropharynx moist,neck is supple      Commons side effects, risks, benefits, and alternatives for medications and treatment plan prescribed today were discussed, and the patient expressed understanding of the given instructions. Patient is instructed to call or message via MyChart if he/she has any  questions or concerns regarding our treatment plan. No barriers to understanding were identified. We discussed Red Flag symptoms and signs in detail. Patient expressed understanding regarding what to do in case of urgent or emergency type symptoms.   Medication list was reconciled, printed and provided to the patient in AVS. Patient instructions and summary information was reviewed with the patient as documented in the AVS. This note was prepared with assistance of Dragon voice recognition software. Occasional wrong-word or sound-a-like substitutions may have occurred due to the inherent limitations of voice recognition software

## 2018-07-16 NOTE — Patient Instructions (Signed)
Please follow up if symptoms do not improve or as needed.   Good luck to you!

## 2018-07-16 NOTE — Addendum Note (Signed)
Addended by: Francis Dowse T on: 07/16/2018 02:58 PM   Modules accepted: Orders

## 2018-07-19 LAB — HEPATITIS B SURFACE ANTIGEN: Hepatitis B Surface Ag: NONREACTIVE

## 2018-07-19 LAB — HIV ANTIBODY (ROUTINE TESTING W REFLEX): HIV 1&2 Ab, 4th Generation: NONREACTIVE

## 2018-07-19 LAB — URINE CYTOLOGY ANCILLARY ONLY
Chlamydia: NEGATIVE
Neisseria Gonorrhea: NEGATIVE
Trichomonas: NEGATIVE

## 2018-07-19 LAB — RPR: RPR Ser Ql: NONREACTIVE

## 2018-07-20 NOTE — Telephone Encounter (Signed)
Put her on the cancellation list.  She just got out of the hospital.  Need 30-minute

## 2018-07-20 NOTE — Progress Notes (Signed)
Please call patient: I have reviewed his/her lab results. Great news: all STD screening is negative at this time.

## 2018-07-21 ENCOUNTER — Ambulatory Visit (INDEPENDENT_AMBULATORY_CARE_PROVIDER_SITE_OTHER): Payer: PRIVATE HEALTH INSURANCE | Admitting: Psychology

## 2018-07-21 DIAGNOSIS — F411 Generalized anxiety disorder: Secondary | ICD-10-CM | POA: Diagnosis not present

## 2018-07-21 DIAGNOSIS — F429 Obsessive-compulsive disorder, unspecified: Secondary | ICD-10-CM | POA: Diagnosis not present
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
## 2018-07-26 ENCOUNTER — Ambulatory Visit (INDEPENDENT_AMBULATORY_CARE_PROVIDER_SITE_OTHER): Payer: PRIVATE HEALTH INSURANCE | Admitting: Psychiatry

## 2018-07-26 ENCOUNTER — Encounter: Payer: Self-pay | Admitting: Psychiatry

## 2018-07-26 ENCOUNTER — Other Ambulatory Visit: Payer: Self-pay

## 2018-07-26 ENCOUNTER — Ambulatory Visit (INDEPENDENT_AMBULATORY_CARE_PROVIDER_SITE_OTHER): Payer: PRIVATE HEALTH INSURANCE | Admitting: Family Medicine

## 2018-07-26 ENCOUNTER — Encounter: Payer: Self-pay | Admitting: Family Medicine

## 2018-07-26 DIAGNOSIS — F332 Major depressive disorder, recurrent severe without psychotic features: Secondary | ICD-10-CM | POA: Diagnosis not present

## 2018-07-26 DIAGNOSIS — R1084 Generalized abdominal pain: Secondary | ICD-10-CM

## 2018-07-26 DIAGNOSIS — Z3490 Encounter for supervision of normal pregnancy, unspecified, unspecified trimester: Secondary | ICD-10-CM

## 2018-07-26 DIAGNOSIS — O210 Mild hyperemesis gravidarum: Secondary | ICD-10-CM | POA: Diagnosis not present

## 2018-07-26 DIAGNOSIS — F422 Mixed obsessional thoughts and acts: Secondary | ICD-10-CM

## 2018-07-26 DIAGNOSIS — F322 Major depressive disorder, single episode, severe without psychotic features: Secondary | ICD-10-CM

## 2018-07-26 DIAGNOSIS — F4001 Agoraphobia with panic disorder: Secondary | ICD-10-CM | POA: Diagnosis not present

## 2018-07-26 MED ORDER — ONDANSETRON 4 MG PO TBDP: 4.0000 mg | ORAL_TABLET | Freq: Three times a day (TID) | ORAL | 0 refills | Status: DC | PRN

## 2018-07-26 MED ORDER — ENBRACE HR PO CAPS: 1.0000 | ORAL_CAPSULE | Freq: Every day | ORAL | 2 refills | Status: DC

## 2018-07-26 NOTE — Progress Notes (Signed)
Virtual Visit via Video Note  Subjective  CC:  Chief Complaint  Patient presents with  . Nausea    She is [redacted] wks pregnant and nausea started this past week     I connected with [name] on 07/26/18 at  2:20 PM EDT by a video enabled telemedicine application and verified that I am speaking with the correct person using two identifiers. Location patient: Home Location provider: [facility] [place], Office Persons participating in the virtual visit: Oretta Berkland, Leamon Arnt, MD Lilli Light, [place] discussed the limitations of evaluation and management by telemedicine and the availability of in person appointments. The patient expressed understanding and agreed to proceed. HPI: Gina Gillespie is a 28 y.o. female who was contacted today to address the problems listed above in the chief complaint. . 28 year old G3, P2 at approximately 8 weeks estimated gestational age complains of abdominal pain.  She reports she has had 4-5 days of postprandial upper abdominal pain that she cannot quite describe.  She denies a burning pain.  She does admit to abdominal cramping, intermittent loose stools versus constipation.  She is only had one episode of vomiting.  She has intermittent nausea.  She does not have an overwhelming persistent sensation of nausea.  She did not suffer from hyperemesis gravidarum with her prior 2 pregnancies.  She denies hematemesis, melena, fevers or chills. . See last visit from 10 days ago.  She says she is feeling much better.  She did see her psychiatrist this morning.  They are adjusting medications.  She feels less stressed.  She is is seeing a Social worker for marital counseling and individual counseling as we discussed. . She has no history of IBS or abdominal illnesses.  There are no sick contacts at home. Assessment  1. Generalized abdominal pain   2. Morning sickness   3. Early stage of pregnancy   4. MDD (major depressive  disorder), single episode, severe , no psychosis ([place])      Plan   Symptoms are entirely consistent with pregnancy associated nausea: Concern of other etiologies due to abdominal pain, constipation and diarrhea.  Differential includes acute gastritis, stress-induced, IBS, infectious gastroenteritis, biliary tract disease versus other.  Education counseling given.  Unable to examine patient due to virtual visit.  Recommend monitoring temperature, trial of Zofran and Tums and follow-up in the office this week if pain persists.  Patient understands and agrees.  Mood disorder: Improving. I discussed the assessment and treatment plan with the patient. The patient was provided an opportunity to ask questions and all were answered. The patient agreed with the plan and demonstrated an understanding of the instructions.   The patient was advised to call back or seek an in-person evaluation if the symptoms worsen or if the condition fails to improve as anticipated. Follow up: 1 week if needed Visit date not found  Meds ordered this encounter  Medications  . ondansetron (ZOFRAN ODT) 4 MG disintegrating tablet    Sig: Take 1 tablet (4 mg total) by mouth every 8 (eight) hours as needed for nausea or vomiting.    Dispense:  20 tablet    Refill:  0      I reviewed the patients updated PMH, FH, and SocHx.    Patient Active Problem List   Diagnosis Date Noted  . MDD (major depressive disorder), single episode, severe , no psychosis ([place]) 07/06/2018  . Severe recurrent major depression without psychotic features (  HCC) 07/05/2018  . GAD (generalized anxiety disorder) 03/22/2018  . Bilateral leg pain 08/09/2017   Current Meds  Medication Sig  . Prenat Vit-Fe Gly Cys-FA-Omega (ENBRACE HR) CAPS Take 1 capsule by mouth daily.  . sertraline (ZOLOFT) 100 MG tablet Take 200 mg by mouth daily.    Allergies: Patient has No Known Allergies. Family History: Patient family history includes Alcohol abuse in  her father, maternal grandfather, and paternal grandfather; Arthritis in her maternal grandmother; Cancer in her father, maternal grandfather, and paternal grandfather; Depression in her father and maternal grandmother; Diabetes in her maternal grandmother; Early death in her paternal grandmother; Hearing loss in her maternal grandmother; Hypertension in her maternal grandmother; Mental illness in her brother and father. Social History:  Patient  reports that she has never smoked. She has never used smokeless tobacco. She reports previous alcohol use. She reports that she does not use drugs.  Review of Systems: Constitutional: Negative for fever malaise or anorexia Cardiovascular: negative for chest pain Respiratory: negative for SOB or persistent cough Gastrointestinal: negative for abdominal pain  OBJECTIVE Vitals: LMP 03/13/2018 (Exact Date)  General: no acute distress , A&Ox3, appears well  Willow Oraamille L Karmine Kauer, MD

## 2018-07-26 NOTE — Progress Notes (Signed)
Gina Chandlerlexis Meanor [id number] Mar 04, 1990 28 y.o.  Subjective:   Patient ID:  Gina Gillespie is a 28 y.o. (DOB Mar 04, 1990) female.  Chief Complaint:  No chief complaint on file.   HPI Gina Gillespie presents to the office today for follow-up of OCDand recent hospitalization for SI DT marital crisis.  Last seen Feb 2020.  There were no med changes.  She was admitted voluntarily on June 8 and discharged on June 10 from [facility].  She was pregnant and it appears sertraline was reduced to 200mg  from 300 mg.  Currently [redacted] week pregnant with a lot of morning sickness.  Caused near panic bc vomiting is a trigger for OCD and panic  Affair caused her to become very suicidal.  Told husband about it.  Wouldn't have done it.  Started couples therapy with Dr. Dellia CloudGutterman last week with [name] husband. Sharia ReeveJosh is having a hard time.  Brought up a lot of things.  Thinks H will stay and she does too.  Recognizes has issues she needs to work through.  Wants individual therapy too. Started online [facility]- but not sure online will work.  Wants a woman therapist with experience with sexual issues and preferably experience with OCD.  Good.  Pleased with Zoloft.  Will need to split the RX bc new insurance won't cover 300 mg sertraline.  Asks for a couple of Xanax when has bad panic.  It' rare.  Last one before Thanksgiving and was spontaneous.  Patient reports stable mood and denies depressed or irritable moods.  Patient denies any recent difficulty with anxiety, usually.  OCD managed generally. Patient denies difficulty with sleep initiation or maintenance. Denies appetite disturbance.  Patient reports that energy and motivation have been good.  Patient denies any difficulty with concentration.  Patient denies any suicidal ideation.  Youngest child may interfere with sleep.  2 kids  Past Psychiatric Medication Trials:   Review of Systems:  Review of Systems  Constitutional:   Night sweats   Gastrointestinal: Positive for nausea.  Neurological: Negative for tremors and weakness.  Psychiatric/Behavioral: Negative for agitation, behavioral problems, confusion, decreased concentration, dysphoric mood, hallucinations, self-injury, sleep disturbance and suicidal ideas. The patient is nervous/anxious. The patient is not hyperactive.     Medications: I have reviewed the patient's current medications.  Current Outpatient Medications  Medication Sig Dispense Refill  . Prenat Vit-Fe Gly Cys-FA-Omega (ENBRACE HR) CAPS Take 1 capsule by mouth daily. 90 capsule 2   No current facility-administered medications for this visit.     Medication Side Effects: Other: reduced libido and weight gain and night sweats  Allergies: No Known Allergies  Past Medical History:  Diagnosis Date  . Anxiety   . Depression   . History of chicken pox   . OCD (obsessive compulsive disorder)     Family History  Problem Relation Age of Onset  . Alcohol abuse Father   . Cancer Father   . Depression Father   . Mental illness Father   . Mental illness Brother   . Arthritis Maternal Grandmother   . Depression Maternal Grandmother   . Diabetes Maternal Grandmother   . Hearing loss Maternal Grandmother   . Hypertension Maternal Grandmother   . Alcohol abuse Maternal Grandfather   . Cancer Maternal Grandfather   . Early death Paternal Grandmother   . Alcohol abuse Paternal Grandfather   . Cancer Paternal Grandfather     Social History   Socioeconomic History  . Marital status: Married  Spouse name: Not on file  . Number of children: Not on file  . Years of education: Not on file  . Highest education level: Not on file  Occupational History  . Not on file  Social Needs  . Financial resource strain: Not on file  . Food insecurity    Worry: Not on file    Inability: Not on file  . Transportation needs    Medical: Not on file    Non-medical: Not on file  Tobacco Use  .  Smoking status: Never Smoker  . Smokeless tobacco: Never Used  Substance and Sexual Activity  . Alcohol use: Not Currently  . Drug use: Never  . Sexual activity: Yes    Partners: Male  Lifestyle  . Physical activity    Days per week: Not on file    Minutes per session: Not on file  . Stress: Not on file  Relationships  . Social Herbalist on phone: Not on file    Gets together: Not on file    Attends religious service: Not on file    Active member of club or organization: Not on file    Attends meetings of clubs or organizations: Not on file    Relationship status: Not on file  . Intimate partner violence    Fear of current or ex partner: Not on file    Emotionally abused: Not on file    Physically abused: Not on file    Forced sexual activity: Not on file  Other Topics Concern  . Not on file  Social History Narrative  . Not on file    Past Medical History, Surgical history, Social history, and Family history were reviewed and updated as appropriate.   Please see review of systems for further details on the patient's review from today.   Objective:   Physical Exam:  LMP 03/13/2018 (Exact Date)   Physical Exam Constitutional:      General: She is not in acute distress.    Appearance: She is well-developed.  Musculoskeletal:        General: No deformity.  Neurological:     Mental Status: She is alert and oriented to person, place, and time.     Motor: No tremor.     Coordination: Coordination normal.     Gait: Gait normal.  Psychiatric:        Attention and Perception: Attention normal. She is attentive.        Mood and Affect: Mood is anxious. Mood is not depressed. Affect is not labile, blunt, angry or inappropriate.        Speech: Speech normal.        Behavior: Behavior normal.        Thought Content: Thought content normal. Thought content does not include homicidal or suicidal ideation. Thought content does not include homicidal or suicidal plan.         Cognition and Memory: Cognition normal.        Judgment: Judgment normal.     Comments: Insight is good.     Lab Review:     Component Value Date/Time   NA 138 07/06/2018 0651   K 3.9 07/06/2018 0651   CL 106 07/06/2018 0651   CO2 25 07/06/2018 0651   GLUCOSE 99 07/06/2018 0651   BUN 10 07/06/2018 0651   CREATININE 0.61 07/06/2018 0651   CALCIUM 8.7 (L) 07/06/2018 0651   PROT 6.5 07/06/2018 0651   ALBUMIN 3.9 07/06/2018 0865  AST 16 07/06/2018 0651   ALT 17 07/06/2018 0651   ALKPHOS 52 07/06/2018 0651   BILITOT 0.5 07/06/2018 0651   GFRNONAA >60 07/06/2018 0651   GFRAA >60 07/06/2018 0651       Component Value Date/Time   WBC 6.7 07/06/2018 0651   RBC 4.14 07/06/2018 0651   HGB 12.4 07/06/2018 0651   HCT 38.5 07/06/2018 0651   PLT 235 07/06/2018 0651   MCV 93.0 07/06/2018 0651   MCH 30.0 07/06/2018 0651   MCHC 32.2 07/06/2018 0651   RDW 12.8 07/06/2018 0651    No results found for: POCLITH, LITHIUM   No results found for: PHENYTOIN, PHENOBARB, VALPROATE, CBMZ   .res Assessment: Plan:    Diagnoses and all orders for this visit:  Severe recurrent major depression without psychotic features (HCC) -     Prenat Vit-Fe Gly Cys-FA-Omega (ENBRACE HR) CAPS; Take 1 capsule by mouth daily.  Mixed obsessional thoughts and acts -     Prenat Vit-Fe Gly Cys-FA-Omega (ENBRACE HR) CAPS; Take 1 capsule by mouth daily.  Panic disorder with agoraphobia     Greater than 50% of face to face time with patient was spent on counseling and coordination of care. We discussed the following. Patient with recent marital crisis leading to suicidal thoughts and a hospitalization.  Suicidal thoughts have resolved.  Her depression is improving.  She is hopeful about the future.  She is pregnant about 8 weeks.  Discussed the pros and cons of serotonin medicines in pregnancy and the potential unknowns as well affecting pregnancy.  She clearly has severe OCD which will certainly  worsen if she stops the medication.  She had a recent depression with suicidal thoughts.  The risk of stopping the medication exceeds the benefits.  The dosage has been reduced in an attempt to reduce any potential theoretical risk to the pregnancy.  She agrees to continue the medication as is.  Supportive therapy dealing with marital crisis. And possible therapists and how to deal with the crisis in the interim. Has prexisting sexual issues she needs to address also.  Disc risk Xanax for panic in pregnancy.  We discussed the short-term risks associated with benzodiazepines including sedation and increased fall risk among others.  Discussed long-term side effect risk including dependence, potential withdrawal symptoms, and the potential eventual dose-related risk of dementia.  Agree with RX [facility]brace PNV bc this is a pregnant psych patient on high dosage of sertaline medically necessary bc of severe OCD and recent depression with SI.  This appt was 30 mins.  FU 6 weeks  Meredith Staggersarey Cottle, MD, DFAPA    Please see After Visit Summary for patient specific instructions.  Future Appointments  Date Time Provider Department Center  07/26/2018  2:20 PM Willow OraAndy, Camille L, MD [facility]  07/27/2018  9:30 AM Haze RushingGutterman, David L, PhD [facility] None  08/02/2018  5:00 PM Haze RushingGutterman, David L, PhD [facility] None  08/16/2018  5:00 PM Haze RushingGutterman, David L, PhD [facility] None  09/06/2018  9:00 AM Cottle, Steva Readyarey G Jr., MD [facility] None    No orders of the defined types were placed in this encounter.     -------------------------------

## 2018-07-27 ENCOUNTER — Ambulatory Visit (INDEPENDENT_AMBULATORY_CARE_PROVIDER_SITE_OTHER): Payer: PRIVATE HEALTH INSURANCE | Admitting: Psychology

## 2018-07-27 DIAGNOSIS — F411 Generalized anxiety disorder: Secondary | ICD-10-CM

## 2018-07-27 DIAGNOSIS — F429 Obsessive-compulsive disorder, unspecified: Secondary | ICD-10-CM

## 2018-08-02 ENCOUNTER — Ambulatory Visit (INDEPENDENT_AMBULATORY_CARE_PROVIDER_SITE_OTHER): Payer: PRIVATE HEALTH INSURANCE | Admitting: Psychology

## 2018-08-02 DIAGNOSIS — F332 Major depressive disorder, recurrent severe without psychotic features: Secondary | ICD-10-CM

## 2018-08-06 ENCOUNTER — Ambulatory Visit (INDEPENDENT_AMBULATORY_CARE_PROVIDER_SITE_OTHER): Payer: PRIVATE HEALTH INSURANCE | Admitting: Professional

## 2018-08-06 DIAGNOSIS — F411 Generalized anxiety disorder: Secondary | ICD-10-CM

## 2018-08-09 ENCOUNTER — Ambulatory Visit (INDEPENDENT_AMBULATORY_CARE_PROVIDER_SITE_OTHER): Payer: PRIVATE HEALTH INSURANCE | Admitting: Psychology

## 2018-08-09 ENCOUNTER — Other Ambulatory Visit: Payer: Self-pay | Admitting: Family Medicine

## 2018-08-09 DIAGNOSIS — F332 Major depressive disorder, recurrent severe without psychotic features: Secondary | ICD-10-CM

## 2018-08-09 NOTE — Telephone Encounter (Signed)
Please call: when does she see her OB? I have refilled zofran, #10. Needs f/u visit or OB to manage if persisting. Thanks.

## 2018-08-09 NOTE — Telephone Encounter (Signed)
LMOVM for pt to return call 

## 2018-08-10 ENCOUNTER — Ambulatory Visit: Payer: PRIVATE HEALTH INSURANCE | Admitting: Psychology

## 2018-08-11 ENCOUNTER — Telehealth (INDEPENDENT_AMBULATORY_CARE_PROVIDER_SITE_OTHER): Payer: PRIVATE HEALTH INSURANCE | Admitting: *Deleted

## 2018-08-11 ENCOUNTER — Other Ambulatory Visit: Payer: Self-pay

## 2018-08-11 DIAGNOSIS — Z3490 Encounter for supervision of normal pregnancy, unspecified, unspecified trimester: Secondary | ICD-10-CM

## 2018-08-11 MED ORDER — AMBULATORY NON FORMULARY MEDICATION: 1.0000 | 0 refills | Status: DC

## 2018-08-11 NOTE — Progress Notes (Signed)
I connected with  [name] on 08/11/18 at  1:30 PM EDT by telephone and verified that I am speaking with the correct person using two identifiers.   I discussed the limitations, risks, security and privacy concerns of performing an evaluation and management service by telephone and virtually and the availability of in person appointments. I also discussed with the patient that there may be a patient responsible charge related to this service. The patient expressed understanding and agreed to proceed.  We discussed EDD is based on US done at [facility]  And she is uncertain when her LMP was.  States she is not good at keeping up with that. She is concerned about her dating and paternity of her baby because she had intercourse once with someone other than husband around same time. Discussed will discuss with provider if another dating [place] is needed. Reviewed [place] report and history with Roni Bread and dating [place] ordered to be done here in MFM. Dating [place] ordered and notified pt due to covid the earliest it can be done at this time is 09/02/18 at 0800. I enrolled her in babyscripts app and explained I will order a bp cuff to be delivered for her to take bp weekly and enter into babyscripts app. Also reviewed her new ob appt and that physical exam , ob labs, pap if needed, genetic testing will be done. Also that we would like to do fasting cbg if she can be fasting ( does not need hgb A1c because one was done in June at time of + pregnancy test.  She voices understanding .  Linda,RN 08/11/2018  1:20 PM

## 2018-08-12 ENCOUNTER — Ambulatory Visit (INDEPENDENT_AMBULATORY_CARE_PROVIDER_SITE_OTHER): Payer: PRIVATE HEALTH INSURANCE | Admitting: Professional

## 2018-08-12 ENCOUNTER — Telehealth: Payer: Self-pay | Admitting: Family Medicine

## 2018-08-12 DIAGNOSIS — F411 Generalized anxiety disorder: Secondary | ICD-10-CM | POA: Diagnosis not present

## 2018-08-12 NOTE — Telephone Encounter (Signed)
Patient called in stating that she would like to cancel her appointment on 7/21. Patient stated that she is already seeing 2 other behavioral health doctors and her insurance only covers one. Patient stated that she just wants to cancel the appointment with [place] altogether.

## 2018-08-16 ENCOUNTER — Ambulatory Visit (INDEPENDENT_AMBULATORY_CARE_PROVIDER_SITE_OTHER): Payer: PRIVATE HEALTH INSURANCE | Admitting: Psychology

## 2018-08-16 DIAGNOSIS — F332 Major depressive disorder, recurrent severe without psychotic features: Secondary | ICD-10-CM | POA: Diagnosis not present

## 2018-08-17 ENCOUNTER — Institutional Professional Consult (permissible substitution): Payer: PRIVATE HEALTH INSURANCE

## 2018-08-17 ENCOUNTER — Ambulatory Visit (INDEPENDENT_AMBULATORY_CARE_PROVIDER_SITE_OTHER): Payer: PRIVATE HEALTH INSURANCE | Admitting: Professional

## 2018-08-17 DIAGNOSIS — F411 Generalized anxiety disorder: Secondary | ICD-10-CM

## 2018-08-24 ENCOUNTER — Other Ambulatory Visit: Payer: Self-pay | Admitting: Medical

## 2018-08-24 ENCOUNTER — Ambulatory Visit (HOSPITAL_COMMUNITY)
Admission: RE | Admit: 2018-08-24 | Discharge: 2018-08-24 | Disposition: A | Discharge status: Home / Self Care | Payer: PRIVATE HEALTH INSURANCE | Source: Ambulatory Visit | Attending: Medical | Admitting: Medical

## 2018-08-24 ENCOUNTER — Encounter: Payer: PRIVATE HEALTH INSURANCE | Admitting: Advanced Practice Midwife

## 2018-08-24 ENCOUNTER — Ambulatory Visit: Payer: PRIVATE HEALTH INSURANCE | Admitting: Professional

## 2018-08-24 ENCOUNTER — Other Ambulatory Visit: Payer: Self-pay

## 2018-08-24 ENCOUNTER — Ambulatory Visit (INDEPENDENT_AMBULATORY_CARE_PROVIDER_SITE_OTHER): Payer: PRIVATE HEALTH INSURANCE | Admitting: General Practice

## 2018-08-24 ENCOUNTER — Ambulatory Visit (INDEPENDENT_AMBULATORY_CARE_PROVIDER_SITE_OTHER): Payer: PRIVATE HEALTH INSURANCE | Admitting: Psychology

## 2018-08-24 DIAGNOSIS — Z3490 Encounter for supervision of normal pregnancy, unspecified, unspecified trimester: Secondary | ICD-10-CM | POA: Diagnosis not present

## 2018-08-24 DIAGNOSIS — F332 Major depressive disorder, recurrent severe without psychotic features: Secondary | ICD-10-CM | POA: Diagnosis not present

## 2018-08-24 DIAGNOSIS — Z712 Person consulting for explanation of examination or test findings: Secondary | ICD-10-CM

## 2018-08-24 NOTE — Progress Notes (Signed)
Patient presents to office today for dating ultrasound results. Results reviewed with Dr Nehemiah Settle who finds single living IUP- patient should begin prenatal care.  Informed patient of results, reviewed dating, & provided pictures. Patient verbalized understanding and asked when she could have gotten pregnant due to uncertain paternity. Told patient there isn't a way to know for certain and the only way to know for sure is testing after birth. Discussed this at length with patient and possible conception dates. Patient verbalized understanding and will return for new OB visit on 7/30.   Koren Bound RN BSN 08/24/18

## 2018-08-24 NOTE — Progress Notes (Signed)
Chart reviewed - agree with RN documentation.   

## 2018-08-26 ENCOUNTER — Ambulatory Visit (INDEPENDENT_AMBULATORY_CARE_PROVIDER_SITE_OTHER): Payer: PRIVATE HEALTH INSURANCE | Admitting: Obstetrics and Gynecology

## 2018-08-26 ENCOUNTER — Other Ambulatory Visit: Payer: Self-pay

## 2018-08-26 ENCOUNTER — Ambulatory Visit (INDEPENDENT_AMBULATORY_CARE_PROVIDER_SITE_OTHER): Payer: PRIVATE HEALTH INSURANCE | Admitting: Professional

## 2018-08-26 VITALS — BP 98/63 | HR 75 | Wt 144.0 lb

## 2018-08-26 DIAGNOSIS — Z124 Encounter for screening for malignant neoplasm of cervix: Secondary | ICD-10-CM

## 2018-08-26 DIAGNOSIS — F411 Generalized anxiety disorder: Secondary | ICD-10-CM

## 2018-08-26 DIAGNOSIS — Z3A12 12 weeks gestation of pregnancy: Secondary | ICD-10-CM

## 2018-08-26 DIAGNOSIS — Z3491 Encounter for supervision of normal pregnancy, unspecified, first trimester: Secondary | ICD-10-CM

## 2018-08-26 DIAGNOSIS — Z6379 Other stressful life events affecting family and household: Secondary | ICD-10-CM

## 2018-08-26 MED ORDER — BLOOD PRESSURE KIT DEVI: 1.0000 | Freq: Once | 0 refills | Status: AC

## 2018-08-26 MED ORDER — AMBULATORY NON FORMULARY MEDICATION: 1.0000 | Freq: Once | 0 refills | Status: AC

## 2018-08-26 NOTE — Progress Notes (Signed)
History:   Gina Gillespie is a 28 y.o. G3P2002 at 13w5dby early ultrasound being seen today for her first obstetrical visit.  Her obstetrical history is significant for anxiety/ depression.  Patient does intend to breast feed. Pregnancy history fully reviewed.  Patient reports stress. Recently had an affair on her husband in May and there is some question about who the father of the baby is. Plans a Home birth. She birthed boy of her other children at home due to severe stress being in a hospital setting. She had a behavioral health admission back in June due to thoughts of suicide after the affair. She is planning invitro fertility testing.  She is in regular therapy.   HISTORY: OB History  Gravida Para Term Preterm AB Living  _0 0 0 2  SAB TAB Ectopic Multiple Live Births  0 0 0 0 2    # Outcome Date GA Lbr Len/2nd Weight Sex Delivery Anes PTL Lv  3 Current           2 Term 12/21/15 361w4d6 lb 15 oz (3.147 kg) F Vag-Spont None  LIV  1 Term 02/01/14 4077w0d lb 7 oz (3.827 kg) M Vag-Spont None  LIV    Last pap smear was done does not recall and was normal  Past Medical History:  Diagnosis Date  . Anxiety   . Depression   . History of chicken pox   . OCD (obsessive compulsive disorder)    No past surgical history on file. Family History  Problem Relation Age of Onset  . Alcohol abuse Father   . Cancer Father   . Depression Father   . Mental illness Father   . Throat cancer Father   . Mental illness Brother   . Arthritis Maternal Grandmother   . Depression Maternal Grandmother   . Diabetes Maternal Grandmother   . Hearing loss Maternal Grandmother   . Hypertension Maternal Grandmother   . Alcohol abuse Maternal Grandfather   . Cancer Maternal Grandfather   . Early death Paternal Grandmother   . Alcohol abuse Paternal Grandfather   . Cancer Paternal Grandfather    Social History   Tobacco Use  . Smoking status: Never Smoker  . Smokeless tobacco: Never Used   Substance Use Topics  . Alcohol use: Never    Frequency: Never  . Drug use: Never   No Known Allergies Current Outpatient Medications on File Prior to Visit  Medication Sig Dispense Refill  . Docosahexaenoic Acid (PRENATAL DHA PO) Take by mouth.    . PRiley Nearingt-Fe Gly Cys-FA-Omega (ENBRACE HR) CAPS Take 1 capsule by mouth daily. 90 capsule 2  . sertraline (ZOLOFT) 100 MG tablet Take 200 mg by mouth daily.     No current facility-administered medications on file prior to visit.     Review of Systems Pertinent items noted in HPI and remainder of comprehensive ROS otherwise negative. Physical Exam:   Vitals:   08/26/18 1007  BP: 98/63  Pulse: 75  Weight: 144 lb (65.3 kg)   BP 98/63   Pulse 75   Wt 144 lb (65.3 kg)   LMP  (LMP Unknown)   BMI 23.24 kg/m  Uterine Size: size equals dates  Pelvic Exam:    Perineum: No Hemorrhoids, Normal Perineum   Vulva: normal   Vagina:  normal mucosa, normal discharge, no palpable nodules   pH: Not done   Cervix: no bleeding following Pap, no cervical motion tenderness and  no lesions   Adnexa: normal adnexa and no mass, fullness, tenderness   Bony Pelvis: Adequate   Skin: normal coloration and turgor, no rashes    Neurologic: negative   Extremities: normal strength, tone, and muscle mass   HEENT neck supple with midline trachea and thyroid without masses   Mouth/Teeth mucous membranes moist, pharynx normal without lesions   Neck supple and no masses   Cardiovascular: regular rate and rhythm, no murmurs or gallops   Respiratory:  appears well, vitals normal, no respiratory distress, acyanotic, normal RR, neck free of mass or lymphadenopathy, chest clear, no wheezing, crepitations, rhonchi, normal symmetric air entry   Abdomen: soft, non-tender; bowel sounds normal; no masses,  no organomegaly   Urinary: urethral meatus normal    Assessment:    Pregnancy: [id number] Patient Active Problem List   Diagnosis Date Noted  . Stressful life  event affecting family 08/27/2018  . Supervision of low-risk pregnancy 08/11/2018  . MDD (major depressive disorder), single episode, severe , no psychosis ([place]) 07/06/2018  . Severe recurrent major depression without psychotic features ([place]) 07/05/2018  . GAD (generalized anxiety disorder) 03/22/2018  . Bilateral leg pain 08/09/2017     Plan:   1. Encounter for supervision of low-risk pregnancy in first trimester  - ABO/Rh; Future - Antibody screen; Future - Urine Culture - Rubella screen - Cytology - PAP( [place]) - [place] MFM OB COMP + 14 WK; Future - Antibody screen - ABO/Rh   - AMBULATORY NON FORMULARY MEDICATION; 1 Device by Other route once for 1 dose. Medication Name: Blood Pressure Cuff Monitored regularly at home ICD 10 Z34.90  Dispense: 1 Device; Refill: 0 - Blood Pressure Monitoring (BLOOD PRESSURE KIT) DEVI; 1 Device by Does not apply route once for 1 dose. icd 10dx: z34.90  Dispense: 1 Device; Refill: 0   Initial labs drawn. Continue prenatal vitamins. Genetic Screening discussed, None declined. Ultrasound discussed; fetal anatomic survey: ordered. Problem list reviewed and updated. The nature of [place] with multiple MDs and other Advanced Practice Providers was explained to patient; also emphasized that residents, students are part of our team. Routine obstetric precautions reviewed. No follow-ups on file.     , Artist Pais, [place] for [employer], [place]

## 2018-08-27 DIAGNOSIS — Z6379 Other stressful life events affecting family and household: Secondary | ICD-10-CM | POA: Insufficient documentation

## 2018-08-27 LAB — ANTIBODY SCREEN: Antibody Screen: NEGATIVE

## 2018-08-27 LAB — URINE CULTURE: Organism ID, Bacteria: NO GROWTH

## 2018-08-27 LAB — RUBELLA SCREEN: Rubella Antibodies, IGG: 8.96 index (ref >0.99)

## 2018-08-27 LAB — ABO/RH: Rh Factor: POSITIVE

## 2018-08-28 ENCOUNTER — Encounter

## 2018-08-30 ENCOUNTER — Ambulatory Visit (INDEPENDENT_AMBULATORY_CARE_PROVIDER_SITE_OTHER): Payer: PRIVATE HEALTH INSURANCE | Admitting: Psychology

## 2018-08-30 ENCOUNTER — Other Ambulatory Visit: Payer: Self-pay | Admitting: Obstetrics and Gynecology

## 2018-08-30 DIAGNOSIS — F332 Major depressive disorder, recurrent severe without psychotic features: Secondary | ICD-10-CM | POA: Diagnosis not present

## 2018-08-30 DIAGNOSIS — F411 Generalized anxiety disorder: Secondary | ICD-10-CM | POA: Diagnosis not present

## 2018-08-30 DIAGNOSIS — F429 Obsessive-compulsive disorder, unspecified: Secondary | ICD-10-CM

## 2018-08-30 LAB — CYTOLOGY - PAP: Diagnosis: NEGATIVE

## 2018-08-30 MED ORDER — TERCONAZOLE 0.4 % VA CREA: 1.0000 | TOPICAL_CREAM | Freq: Every day | VAGINAL | 0 refills | Status: DC

## 2018-08-30 NOTE — Progress Notes (Signed)
Yeast found on Pap. RX: Terazol Patient made aware.   Noni Saupe I, NP 08/30/2018 1:56 PM

## 2018-09-02 ENCOUNTER — Ambulatory Visit (INDEPENDENT_AMBULATORY_CARE_PROVIDER_SITE_OTHER): Payer: PRIVATE HEALTH INSURANCE | Admitting: Professional

## 2018-09-02 ENCOUNTER — Ambulatory Visit (HOSPITAL_COMMUNITY): Payer: PRIVATE HEALTH INSURANCE

## 2018-09-02 DIAGNOSIS — F411 Generalized anxiety disorder: Secondary | ICD-10-CM | POA: Diagnosis not present

## 2018-09-06 ENCOUNTER — Encounter

## 2018-09-06 ENCOUNTER — Ambulatory Visit: Payer: Self-pay | Admitting: Psychiatry

## 2018-09-07 ENCOUNTER — Encounter

## 2018-09-07 ENCOUNTER — Ambulatory Visit (INDEPENDENT_AMBULATORY_CARE_PROVIDER_SITE_OTHER): Payer: PRIVATE HEALTH INSURANCE | Admitting: Psychology

## 2018-09-07 DIAGNOSIS — F332 Major depressive disorder, recurrent severe without psychotic features: Secondary | ICD-10-CM

## 2018-09-09 ENCOUNTER — Ambulatory Visit: Payer: PRIVATE HEALTH INSURANCE | Admitting: Professional

## 2018-09-15 ENCOUNTER — Ambulatory Visit (INDEPENDENT_AMBULATORY_CARE_PROVIDER_SITE_OTHER): Payer: PRIVATE HEALTH INSURANCE | Admitting: Psychology

## 2018-09-15 DIAGNOSIS — F332 Major depressive disorder, recurrent severe without psychotic features: Secondary | ICD-10-CM

## 2018-09-16 ENCOUNTER — Ambulatory Visit: Payer: PRIVATE HEALTH INSURANCE | Admitting: Professional

## 2018-09-21 ENCOUNTER — Ambulatory Visit (INDEPENDENT_AMBULATORY_CARE_PROVIDER_SITE_OTHER): Payer: PRIVATE HEALTH INSURANCE | Admitting: Psychology

## 2018-09-21 DIAGNOSIS — F332 Major depressive disorder, recurrent severe without psychotic features: Secondary | ICD-10-CM | POA: Diagnosis not present

## 2018-09-22 ENCOUNTER — Encounter: Payer: Self-pay | Admitting: Psychiatry

## 2018-09-22 ENCOUNTER — Other Ambulatory Visit: Payer: Self-pay

## 2018-09-22 ENCOUNTER — Ambulatory Visit (INDEPENDENT_AMBULATORY_CARE_PROVIDER_SITE_OTHER): Payer: PRIVATE HEALTH INSURANCE | Admitting: Psychiatry

## 2018-09-22 ENCOUNTER — Telehealth: Payer: Self-pay | Admitting: Obstetrics and Gynecology

## 2018-09-22 DIAGNOSIS — F332 Major depressive disorder, recurrent severe without psychotic features: Secondary | ICD-10-CM | POA: Diagnosis not present

## 2018-09-22 DIAGNOSIS — F4001 Agoraphobia with panic disorder: Secondary | ICD-10-CM | POA: Diagnosis not present

## 2018-09-22 DIAGNOSIS — F422 Mixed obsessional thoughts and acts: Secondary | ICD-10-CM | POA: Diagnosis not present

## 2018-09-22 NOTE — Telephone Encounter (Signed)
Attempted to contact patient about her appointment on 8/27 @ 8:15. Patient instructed that the appointment is a mychart visit. Patient instructed to download the mychart app if not already done so. Patient instructed to give the office a call with any concerns.

## 2018-09-22 NOTE — Progress Notes (Signed)
Gina Gillespie [id number] 07-27-1990 28 y.o.  Subjective:   Patient ID:  Gina Gillespie is a 28 y.o. (DOB [birth year]) female.  Chief Complaint:  Chief Complaint  Patient presents with  . Follow-up    Medication Management  . Depression    Medication Management  . Other    OCD    Depression        Associated symptoms include no decreased concentration and no suicidal ideas.  Gina Gillespie presents to the office today for follow-up of OCDand recent hospitalization for SI DT marital crisis.  Last seen July 26, 2018.  She was pregnant and had reduced sertraline to 200 mg daily.  OK but not great.  Fine with meds.  Seeing therapist in [place] and she and H see Dr. Cheryln Manly.  Right now they are separated and he's in [place] with his parents.  He had anger outburst last week and she felt it was hurtful to son.  So he left.  Hard being alone.  Not sure what will happen.  Doesn't like being alone but handling it better than in the past.  Gina Gillespie's mother is coming to help her and she's ok with that.  Currently [redacted] week pregnant with a lot of morning sickness.  EDC 2/5. Tuesday Gina Gillespie quit his job.  Stress worsening obsessive thoughts and occ near panic and occ use of Xanax.  Caused near panic bc vomiting is a trigger for OCD and panic  Still some depression to a moderate degree witht the situation.  denies  irritable moods.  Patient denies any recent difficulty with anxiety, usually.  OCD managed well-enough generally. Patient denies difficulty with sleep initiation or maintenance. Denies appetite disturbance.  Patient reports that energy and motivation have been good.  Patient denies any difficulty with concentration.  Patient denies any suicidal ideation.  Some hopeless feelings.  Youngest child may interfere with sleep.  2 kids.  Took sertraline 200 while pregnant with Gina Gillespie but still had ppd.  Disc OB concerns about the dosage and wondered about switching to Wellbutrin.  She was  admitted voluntarily on June 8 and discharged on June 10 from [facility] behavioral [facility].  She was pregnant and it appears sertraline was reduced to 200mg  from 300 mg.  Past Psychiatric Medication Trials:   Sertraline 300, Lexapro   Review of Systems:  Review of Systems  Constitutional:       Night sweats   Gastrointestinal: Positive for nausea.  Neurological: Negative for tremors and weakness.  Psychiatric/Behavioral: Positive for depression. Negative for agitation, behavioral problems, confusion, decreased concentration, dysphoric mood, hallucinations, self-injury, sleep disturbance and suicidal ideas. The patient is nervous/anxious. The patient is not hyperactive.     Medications: I have reviewed the patient's current medications.  Current Outpatient Medications  Medication Sig Dispense Refill  . Docosahexaenoic Acid (PRENATAL DHA PO) Take by mouth.    Riley Nearing Vit-Fe Gly Cys-FA-Omega (ENBRACE HR) CAPS Take 1 capsule by mouth daily. 90 capsule 2  . sertraline (ZOLOFT) 100 MG tablet Take 200 mg by mouth daily.     No current facility-administered medications for this visit.     Medication Side Effects: Other: reduced libido and weight gain and night sweats  Allergies: No Known Allergies  Past Medical History:  Diagnosis Date  . Anxiety   . Depression   . History of chicken pox   . OCD (obsessive compulsive disorder)     Family History  Problem Relation Age of Onset  . Alcohol abuse Father   .  Cancer Father   . Depression Father   . Mental illness Father   . Throat cancer Father   . Mental illness Brother   . Arthritis Maternal Grandmother   . Depression Maternal Grandmother   . Diabetes Maternal Grandmother   . Hearing loss Maternal Grandmother   . Hypertension Maternal Grandmother   . Alcohol abuse Maternal Grandfather   . Cancer Maternal Grandfather   . Early death Paternal Grandmother   . Alcohol abuse Paternal Grandfather   . Cancer Paternal Grandfather      Social History   Socioeconomic History  . Marital status: Married    Spouse name: Not on file  . Number of children: Not on file  . Years of education: Not on file  . Highest education level: Not on file  Occupational History  . Not on file  Social Needs  . Financial resource strain: Not on file  . Food insecurity    Worry: Never true    Inability: Never true  . Transportation needs    Medical: No    Non-medical: No  Tobacco Use  . Smoking status: Never Smoker  . Smokeless tobacco: Never Used  Substance and Sexual Activity  . Alcohol use: Never    Frequency: Never  . Drug use: Never  . Sexual activity: Yes    Partners: Male    Birth control/protection: Condom    Comment: did not use condoms consistently  Lifestyle  . Physical activity    Days per week: Not on file    Minutes per session: Not on file  . Stress: Not on file  Relationships  . Social Musicianconnections    Talks on phone: Not on file    Gets together: Not on file    Attends religious service: Not on file    Active member of club or organization: Not on file    Attends meetings of clubs or organizations: Not on file    Relationship status: Not on file  . Intimate partner violence    Fear of current or ex partner: Not on file    Emotionally abused: Not on file    Physically abused: Not on file    Forced sexual activity: Not on file  Other Topics Concern  . Not on file  Social History Narrative  . Not on file    Past Medical History, Surgical history, Social history, and Family history were reviewed and updated as appropriate.   Please see review of systems for further details on the patient's review from today.   Objective:   Physical Exam:  LMP  (LMP Unknown)   Physical Exam Constitutional:      General: She is not in acute distress.    Appearance: She is well-developed.  Musculoskeletal:        General: No deformity.  Neurological:     Mental Status: She is alert and oriented to person,  place, and time.     Motor: No tremor.     Coordination: Coordination normal.     Gait: Gait normal.  Psychiatric:        Attention and Perception: Attention normal. She is attentive.        Mood and Affect: Mood is anxious and depressed. Affect is not labile, blunt, angry or inappropriate.        Speech: Speech normal.        Behavior: Behavior normal.        Thought Content: Thought content normal. Thought content does not include  homicidal or suicidal ideation. Thought content does not include homicidal or suicidal plan.        Cognition and Memory: Cognition normal.        Judgment: Judgment normal.     Comments: Insight is good.     Lab Review:     Component Value Date/Time   NA 138 07/06/2018 0651   K 3.9 07/06/2018 0651   CL 106 07/06/2018 0651   CO2 25 07/06/2018 0651   GLUCOSE 99 07/06/2018 0651   BUN 10 07/06/2018 0651   CREATININE 0.61 07/06/2018 0651   CALCIUM 8.7 (L) 07/06/2018 0651   PROT 6.5 07/06/2018 0651   ALBUMIN 3.9 07/06/2018 0651   AST 16 07/06/2018 0651   ALT 17 07/06/2018 0651   ALKPHOS 52 07/06/2018 0651   BILITOT 0.5 07/06/2018 0651   GFRNONAA >60 07/06/2018 0651   GFRAA >60 07/06/2018 0651       Component Value Date/Time   WBC 6.7 07/06/2018 0651   RBC 4.14 07/06/2018 0651   HGB 12.4 07/06/2018 0651   HCT 38.5 07/06/2018 0651   PLT 235 07/06/2018 0651   MCV 93.0 07/06/2018 0651   MCH 30.0 07/06/2018 0651   MCHC 32.2 07/06/2018 0651   RDW 12.8 07/06/2018 0651    No results found for: POCLITH, LITHIUM   No results found for: PHENYTOIN, PHENOBARB, VALPROATE, CBMZ   .res Assessment: Plan:    Taundra was seen today for follow-up, depression and other.  Diagnoses and all orders for this visit:  Mixed obsessional thoughts and acts  Panic disorder with agoraphobia  Severe recurrent major depression without psychotic features (HCC)  Greater than 50% of face to face time with patient was spent on counseling and coordination of care.  We discussed the following. Patient with recent marital crisis leading to suicidal thoughts and a hospitalization.  Suicidal thoughts have resolved.  Her depression has somewhat worsened since the last visit because of the circumstances are with worse.  She is managing.  She does not want a change in medicines.  She is about [redacted] weeks pregnant at this point.Disc OB questions about switch to Wellbutrin which is not effective for OCD.  May reduce the dose 2 weeks before due date to lessen risk withdrawal sx in the infant after delivery.  However she did have some postpartum depression after giving birth to [facility] while on sertraline so we may not reduce the dose in order to try to minimize that risk this time. Discussed the pros and cons of serotonin medicines in pregnancy and the potential unknowns as well affecting pregnancy.  She clearly has severe OCD which will certainly worsen if she stops the medication.  She had a recent depression with suicidal thoughts.  The risk of stopping the medication exceeds the benefits.  The dosage has been reduced in an attempt to reduce any potential theoretical risk to the pregnancy.  She agrees to continue the medication as is.  Supportive therapy dealing with marital crisis.   And possible therapists and how to deal with the crisis in the interim. Has prexisting sexual issues she needs to address also.  Disc risk Xanax for panic in pregnancy.  She is using it very rarely wants every few weeks.  We discussed the short-term risks associated with benzodiazepines including sedation and increased fall risk among others.  Discussed long-term side effect risk including dependence, potential withdrawal symptoms, and the potential eventual dose-related risk of dementia.  Agree with RX [facility] PNV bc this is  a pregnant psych patient on high dosage of sertaline medically necessary bc of severe OCD and recent depression with SI.  This appt was 30 mins.  FU 6 weeks  Meredith Staggersarey  Cottle, MD, DFAPA    Please see After Visit Summary for patient specific instructions.  Future Appointments  Date Time Provider Department Center  09/23/2018  8:15 AM Rasch, Harolyn RutherfordJennifer I, NP [facility]  09/29/2018  4:00 PM Haze RushingGutterman, David L, PhD [facility] None  10/05/2018  5:00 PM Haze RushingGutterman, David L, PhD [facility] None  10/11/2018  8:30 AM [facility] US 1 [facility] M[facility]-US  10/11/2018  5:00 PM Haze RushingGutterman, David L, PhD [facility] None  10/19/2018  5:00 PM Haze RushingGutterman, David L, PhD [facility] None  10/25/2018  5:00 PM Haze RushingGutterman, David L, PhD [facility] None  11/02/2018  5:00 PM Haze RushingGutterman, David L, PhD [facility] None    No orders of the defined types were placed in this encounter.     -------------------------------

## 2018-09-23 ENCOUNTER — Encounter: Payer: Self-pay | Admitting: *Deleted

## 2018-09-23 ENCOUNTER — Telehealth (INDEPENDENT_AMBULATORY_CARE_PROVIDER_SITE_OTHER): Payer: PRIVATE HEALTH INSURANCE | Admitting: Obstetrics and Gynecology

## 2018-09-23 DIAGNOSIS — Z3401 Encounter for supervision of normal first pregnancy, first trimester: Secondary | ICD-10-CM

## 2018-09-23 DIAGNOSIS — Z6379 Other stressful life events affecting family and household: Secondary | ICD-10-CM

## 2018-09-23 DIAGNOSIS — Z3491 Encounter for supervision of normal pregnancy, unspecified, first trimester: Secondary | ICD-10-CM

## 2018-09-23 DIAGNOSIS — Z3A16 16 weeks gestation of pregnancy: Secondary | ICD-10-CM

## 2018-09-23 NOTE — Progress Notes (Signed)
I connected with  Gina Gillespie on 09/23/18 at  8:15 AM EDT by telephone and verified that I am speaking with the correct person using two identifiers.   I discussed the limitations, risks, security and privacy concerns of performing an evaluation and management service by telephone and the availability of in person appointments. I also discussed with the patient that there may be a patient responsible charge related to this service. The patient expressed understanding and agreed to proceed.  Aviva Signs Gina Gillespie, CMA 09/23/2018  8:25 AM   Has a different type of insurance called [employer], may need to provide her with a blood pressure cuff since one has already been ordered and patient has not  Gotten it.

## 2018-09-23 NOTE — Progress Notes (Signed)
   TELEHEALTH VIRTUAL OBSTETRICS VISIT ENCOUNTER NOTE  I connected with Gina Gillespie on 09/23/18 at  8:15 AM EDT by telephone at home and verified that I am speaking with the correct person using two identifiers.   I discussed the limitations, risks, security and privacy concerns of performing an evaluation and management service by telephone and the availability of in person appointments. I also discussed with the patient that there may be a patient responsible charge related to this service. The patient expressed understanding and agreed to proceed.  Subjective:  Gina Gillespie is a 28 y.o. G3P2002 at [redacted]w[redacted]d being followed for ongoing prenatal care.  She is currently monitored for the following issues for this low-risk pregnancy and has Bilateral leg pain; GAD (generalized anxiety disorder); Severe recurrent major depression without psychotic features ([place]); MDD (major depressive disorder), single episode, severe , no psychosis ([place]); Supervision of low-risk pregnancy; and Stressful life event affecting family on their problem list.  Patient reports no complaints. Reports fetal movement. Denies any contractions, bleeding or leaking of fluid.   The following portions of the patient's history were reviewed and updated as appropriate: allergies, current medications, past family history, past medical history, past social history, past surgical history and problem list.   Objective:   General:  Alert, oriented and cooperative.   Mental Status: Normal mood and affect perceived. Normal judgment and thought content.  Rest of physical exam deferred due to type of encounter  Assessment and Plan:  Pregnancy: G3P2002 at [redacted]w[redacted]d 1. Stressful life event affecting family  Recent affair. Did outside genetic testing and baby is the patient's husbands. Patient feels relief.    2. Encounter for supervision of low-risk pregnancy in first trimester  Declines 2 hour glucola- considering Jelly Bean test BP  not checked today. Patient to check and login into babyscripts.    Preterm labor symptoms and general obstetric precautions including but not limited to vaginal bleeding, contractions, leaking of fluid and fetal movement were reviewed in detail with the patient.  I discussed the assessment and treatment plan with the patient. The patient was provided an opportunity to ask questions and all were answered. The patient agreed with the plan and demonstrated an understanding of the instructions. The patient was advised to call back or seek an in-person office evaluation/go to [facility]U at [facility] for any urgent or concerning symptoms. Please refer to After Visit Summary for other counseling recommendations.   I provided 12 minutes of non-face-to-face time during this encounter.  No follow-ups on file.  Future Appointments  Date Time Provider [place]  09/29/2018  4:00 PM Oren Binet, PhD [facility] None  10/05/2018  5:00 PM Oren Binet, PhD [facility] None  10/11/2018  8:30 AM [facility]-M[facility] [place] 1 WH-MFCUS MFC-US  10/11/2018  5:00 PM Oren Binet, PhD [facility] None  10/19/2018  5:00 PM Oren Binet, PhD [facility] None  10/25/2018  5:00 PM Oren Binet, PhD [facility] None  11/02/2018  5:00 PM Oren Binet, PhD [facility] None  11/22/2018  9:30 AM Cottle, Billey Co., MD CP-CP None    Noni Saupe, NP [facility], [place]

## 2018-09-29 ENCOUNTER — Ambulatory Visit (INDEPENDENT_AMBULATORY_CARE_PROVIDER_SITE_OTHER): Payer: PRIVATE HEALTH INSURANCE | Admitting: Psychology

## 2018-09-29 DIAGNOSIS — F332 Major depressive disorder, recurrent severe without psychotic features: Secondary | ICD-10-CM | POA: Diagnosis not present

## 2018-09-30 ENCOUNTER — Ambulatory Visit (INDEPENDENT_AMBULATORY_CARE_PROVIDER_SITE_OTHER): Payer: PRIVATE HEALTH INSURANCE | Admitting: Psychology

## 2018-09-30 DIAGNOSIS — F332 Major depressive disorder, recurrent severe without psychotic features: Secondary | ICD-10-CM

## 2018-10-05 ENCOUNTER — Ambulatory Visit: Payer: PRIVATE HEALTH INSURANCE | Admitting: Psychology

## 2018-10-06 ENCOUNTER — Ambulatory Visit (INDEPENDENT_AMBULATORY_CARE_PROVIDER_SITE_OTHER): Payer: PRIVATE HEALTH INSURANCE | Admitting: Psychology

## 2018-10-06 DIAGNOSIS — F332 Major depressive disorder, recurrent severe without psychotic features: Secondary | ICD-10-CM

## 2018-10-11 ENCOUNTER — Other Ambulatory Visit: Payer: Self-pay

## 2018-10-11 ENCOUNTER — Ambulatory Visit: Payer: PRIVATE HEALTH INSURANCE | Admitting: Psychology

## 2018-10-11 ENCOUNTER — Ambulatory Visit (HOSPITAL_COMMUNITY)
Admission: RE | Admit: 2018-10-11 | Discharge: 2018-10-11 | Disposition: A | Discharge status: Home / Self Care | Payer: PRIVATE HEALTH INSURANCE | Source: Ambulatory Visit | Attending: Obstetrics and Gynecology | Admitting: Obstetrics and Gynecology

## 2018-10-11 DIAGNOSIS — Z3491 Encounter for supervision of normal pregnancy, unspecified, first trimester: Secondary | ICD-10-CM | POA: Diagnosis not present

## 2018-10-11 DIAGNOSIS — Z3A19 19 weeks gestation of pregnancy: Secondary | ICD-10-CM | POA: Diagnosis not present

## 2018-10-11 DIAGNOSIS — Z363 Encounter for antenatal screening for malformations: Secondary | ICD-10-CM | POA: Diagnosis not present

## 2018-10-11 DIAGNOSIS — Z3687 Encounter for antenatal screening for uncertain dates: Secondary | ICD-10-CM

## 2018-10-13 ENCOUNTER — Ambulatory Visit (INDEPENDENT_AMBULATORY_CARE_PROVIDER_SITE_OTHER): Payer: PRIVATE HEALTH INSURANCE | Admitting: Psychology

## 2018-10-13 DIAGNOSIS — F332 Major depressive disorder, recurrent severe without psychotic features: Secondary | ICD-10-CM

## 2018-10-19 ENCOUNTER — Ambulatory Visit (INDEPENDENT_AMBULATORY_CARE_PROVIDER_SITE_OTHER): Payer: PRIVATE HEALTH INSURANCE | Admitting: Psychology

## 2018-10-19 DIAGNOSIS — F332 Major depressive disorder, recurrent severe without psychotic features: Secondary | ICD-10-CM | POA: Diagnosis not present

## 2018-10-20 ENCOUNTER — Telehealth: Payer: Self-pay | Admitting: Obstetrics and Gynecology

## 2018-10-20 NOTE — Telephone Encounter (Signed)
Spoke with patient about her appointment on 9/24 @ 8:15. Patient instructed that the appointment is a mychart visit. Patient instructed to download the mychart app if not already done so. Patient verbalized she has the app downloaded

## 2018-10-21 ENCOUNTER — Other Ambulatory Visit: Payer: Self-pay

## 2018-10-21 ENCOUNTER — Telehealth (INDEPENDENT_AMBULATORY_CARE_PROVIDER_SITE_OTHER): Payer: PRIVATE HEALTH INSURANCE | Admitting: Obstetrics and Gynecology

## 2018-10-21 DIAGNOSIS — Z3491 Encounter for supervision of normal pregnancy, unspecified, first trimester: Secondary | ICD-10-CM

## 2018-10-21 DIAGNOSIS — Z3492 Encounter for supervision of normal pregnancy, unspecified, second trimester: Secondary | ICD-10-CM

## 2018-10-21 DIAGNOSIS — Z3A20 20 weeks gestation of pregnancy: Secondary | ICD-10-CM

## 2018-10-21 DIAGNOSIS — Z6379 Other stressful life events affecting family and household: Secondary | ICD-10-CM

## 2018-10-21 NOTE — Patient Instructions (Signed)
Blood Glucose Monitoring, Adult °Monitoring your blood sugar (glucose) is an important part of managing your diabetes (diabetes mellitus). Blood glucose monitoring involves checking your blood glucose as often as directed and keeping a record (log) of your results over time. °Checking your blood glucose regularly and keeping a blood glucose log can: °· Help you and your health care provider adjust your diabetes management plan as needed, including your medicines or insulin. °· Help you understand how food, exercise, illnesses, and medicines affect your blood glucose. °· Let you know what your blood glucose is at any time. You can quickly find out if you have low blood glucose (hypoglycemia) or high blood glucose (hyperglycemia). °Your health care provider will set individualized treatment goals for you. Your goals will be based on your age, other medical conditions you have, and how you respond to diabetes treatment. Generally, the goal of treatment is to maintain the following blood glucose levels: °· Before meals (preprandial): 80-130 mg/dL (4.4-7.2 mmol/L). °· After meals (postprandial): below 180 mg/dL (10 mmol/L). °· A1c level: less than 7%. °Supplies needed: °· Blood glucose meter. °· Test strips for your meter. Each meter has its own strips. You must use the strips that came with your meter. °· A needle to prick your finger (lancet). Do not use a lancet more than one time. °· A device that holds the lancet (lancing device). °· A journal or log book to write down your results. °How to check your blood glucose ° °1. Wash your hands with soap and water. °2. Prick the side of your finger (not the tip) with the lancet. Use a different finger each time. °3. Gently rub the finger until a small drop of blood appears. °4. Follow instructions that come with your meter for inserting the test strip, applying blood to the strip, and using your blood glucose meter. °5. Write down your result and any notes. °Some meters  allow you to use areas of your body other than your finger (alternative sites) to test your blood. The most common alternative sites are: °· Forearm. °· Thigh. °· Palm of the hand. °If you think you may have hypoglycemia, or if you have a history of not knowing when your blood glucose is getting low (hypoglycemia unawareness), do not use alternative sites. Use your finger instead. Alternative sites may not be as accurate as the fingers, because blood flow is slower in these areas. This means that the result you get may be delayed, and it may be different from the result that you would get from your finger. °Follow these instructions at home: °Blood glucose log ° °· Every time you check your blood glucose, write down your result. Also write down any notes about things that may be affecting your blood glucose, such as your diet and exercise for the day. This information can help you and your health care provider: °? Look for patterns in your blood glucose over time. °? Adjust your diabetes management plan as needed. °· Check if your meter allows you to download your records to a computer. Most glucose meters store a record of glucose readings in the meter. °If you have type 1 diabetes: °· Check your blood glucose 2 or more times a day. °· Also check your blood glucose: °? Before every insulin injection. °? Before and after exercise. °? Before meals. °? 2 hours after a meal. °? Occasionally between 2:00 a.m. and 3:00 a.m., as directed. °? Before potentially dangerous tasks, like driving or using heavy machinery. °?   At bedtime. °· You may need to check your blood glucose more often, up to 6-10 times a day, if you: °? Use an insulin pump. °? Need multiple daily injections (MDI). °? Have diabetes that is not well-controlled. °? Are ill. °? Have a history of severe hypoglycemia. °? Have hypoglycemia unawareness. °If you have type 2 diabetes: °· If you take insulin or other diabetes medicines, check your blood glucose 2 or  more times a day. °· If you are on intensive insulin therapy, check your blood glucose 4 or more times a day. Occasionally, you may also need to check between 2:00 a.m. and 3:00 a.m., as directed. °· Also check your blood glucose: °? Before and after exercise. °? Before potentially dangerous tasks, like driving or using heavy machinery. °· You may need to check your blood glucose more often if: °? Your medicine is being adjusted. °? Your diabetes is not well-controlled. °? You are ill. °General tips °· Always keep your supplies with you. °· If you have questions or need help, all blood glucose meters have a 24-hour "hotline" phone number that you can call. You may also contact your health care provider. °· After you use a few boxes of test strips, adjust (calibrate) your blood glucose meter by following instructions that came with your meter. °Contact a health care provider if: °· Your blood glucose is at or above 240 mg/dL (13.3 mmol/L) for 2 days in a row. °· You have been sick or have had a fever for 2 days or longer, and you are not getting better. °· You have any of the following problems for more than 6 hours: °? You cannot eat or drink. °? You have nausea or vomiting. °? You have diarrhea. °Get help right away if: °· Your blood glucose is lower than 54 mg/dL (3 mmol/L). °· You become confused or you have trouble thinking clearly. °· You have difficulty breathing. °· You have moderate or large ketone levels in your urine. °Summary °· Monitoring your blood sugar (glucose) is an important part of managing your diabetes (diabetes mellitus). °· Blood glucose monitoring involves checking your blood glucose as often as directed and keeping a record (log) of your results over time. °· Your health care provider will set individualized treatment goals for you. Your goals will be based on your age, other medical conditions you have, and how you respond to diabetes treatment. °· Every time you check your blood glucose,  write down your result. Also write down any notes about things that may be affecting your blood glucose, such as your diet and exercise for the day. °This information is not intended to replace advice given to you by your health care provider. Make sure you discuss any questions you have with your health care provider. °Document Released: 01/16/2003 Document Revised: 11/06/2017 Document Reviewed: 06/25/2015 °Elsevier Patient Education © 2020 [employer] ° °

## 2018-10-21 NOTE — Progress Notes (Signed)
   TELEHEALTH VIRTUAL OBSTETRICS VISIT ENCOUNTER NOTE  I connected with Gina Gillespie on 10/22/18 at  8:15 AM EDT by telephone at home and verified that I am speaking with the correct person using two identifiers.   I discussed the limitations, risks, security and privacy concerns of performing an evaluation and management service by telephone and the availability of in person appointments. I also discussed with the patient that there may be a patient responsible charge related to this service. The patient expressed understanding and agreed to proceed.  Subjective:  Gina Gillespie is a 28 y.o. G3P2002 at [redacted]w[redacted]d being followed for ongoing prenatal care.  She is currently monitored for the following issues for this low-risk pregnancy and has Bilateral leg pain; GAD (generalized anxiety disorder); Severe recurrent major depression without psychotic features ([place]); MDD (major depressive disorder), single episode, severe , no psychosis ([place]); Supervision of low-risk pregnancy; and Stressful life event affecting family on their problem list.  Patient reports no complaints. Reports fetal movement. Denies any contractions, bleeding or leaking of fluid.   The following portions of the patient's history were reviewed and updated as appropriate: allergies, current medications, past family history, past medical history, past social history, past surgical history and problem list.   Objective:   General:  Alert, oriented and cooperative.   Mental Status: Normal mood and affect perceived. Normal judgment and thought content.  Rest of physical exam deferred due to type of encounter  Assessment and Plan:  Pregnancy: G3P2002 at [redacted]w[redacted]d 1. Stressful life event affecting family  Doing better In therapy regularly   2. Encounter for supervision of low-risk pregnancy in first trimester  Home birth planned. Would like to continue care with our office incase hospital care is needed.  Desires not to do 2 hour  GTT. Does agree to check BS X 1 week and record readings. Will start this between 28-30 weeks. RX sent for all supplies. She is aware she should check her BS 4x per day and record them for 1 week. Encouraged BP monitoring at home. Last BP was 100/58 on 9/3   Preterm labor symptoms and general obstetric precautions including but not limited to vaginal bleeding, contractions, leaking of fluid and fetal movement were reviewed in detail with the patient.  I discussed the assessment and treatment plan with the patient. The patient was provided an opportunity to ask questions and all were answered. The patient agreed with the plan and demonstrated an understanding of the instructions. The patient was advised to call back or seek an in-person office evaluation/go to [facility] at [facility] for any urgent or concerning symptoms. Please refer to After Visit Summary for other counseling recommendations.   I provided 10 minutes of non-face-to-face time during this encounter.  Return in about 4 weeks (around 11/18/2018), or For Virtual visit.  Future Appointments  Date Time Provider [place]  10/26/2018 10:30 AM Oren Binet, PhD [facility] None  11/02/2018  5:00 PM Oren Binet, PhD [facility] None  11/18/2018  3:55 PM Lavonia Drafts, MD [facility] [place]  11/22/2018  9:30 AM Cottle, Billey Co., MD [facility] None    Noni Saupe, NP [facility] [place]

## 2018-10-21 NOTE — Progress Notes (Signed)
8:19a- Called Pt for My Chart visit, no answer, left VM that will call back in 10 minutes.   8:31a- Pt answered.

## 2018-10-25 ENCOUNTER — Ambulatory Visit: Payer: PRIVATE HEALTH INSURANCE | Admitting: Psychology

## 2018-10-26 ENCOUNTER — Ambulatory Visit (INDEPENDENT_AMBULATORY_CARE_PROVIDER_SITE_OTHER): Payer: PRIVATE HEALTH INSURANCE | Admitting: Psychology

## 2018-10-26 DIAGNOSIS — F332 Major depressive disorder, recurrent severe without psychotic features: Secondary | ICD-10-CM | POA: Diagnosis not present

## 2018-11-02 ENCOUNTER — Ambulatory Visit (INDEPENDENT_AMBULATORY_CARE_PROVIDER_SITE_OTHER): Payer: PRIVATE HEALTH INSURANCE | Admitting: Psychology

## 2018-11-02 DIAGNOSIS — F332 Major depressive disorder, recurrent severe without psychotic features: Secondary | ICD-10-CM | POA: Diagnosis not present

## 2018-11-08 ENCOUNTER — Ambulatory Visit (INDEPENDENT_AMBULATORY_CARE_PROVIDER_SITE_OTHER): Payer: PRIVATE HEALTH INSURANCE | Admitting: Psychology

## 2018-11-08 DIAGNOSIS — F332 Major depressive disorder, recurrent severe without psychotic features: Secondary | ICD-10-CM

## 2018-11-18 ENCOUNTER — Encounter: Payer: PRIVATE HEALTH INSURANCE | Admitting: Obstetrics & Gynecology

## 2018-11-18 ENCOUNTER — Ambulatory Visit (INDEPENDENT_AMBULATORY_CARE_PROVIDER_SITE_OTHER): Payer: PRIVATE HEALTH INSURANCE | Admitting: Psychology

## 2018-11-18 ENCOUNTER — Other Ambulatory Visit: Payer: Self-pay

## 2018-11-18 ENCOUNTER — Encounter: Payer: Self-pay | Admitting: Obstetrics & Gynecology

## 2018-11-18 DIAGNOSIS — F332 Major depressive disorder, recurrent severe without psychotic features: Secondary | ICD-10-CM

## 2018-11-18 NOTE — Progress Notes (Signed)
Called pt and pt stated that she needed to reschedule her appt because her day has been so hectic.  Pt stated that she will call the office tomorrow to reschedule her appt.    Mel Almond, RN 11/18/18

## 2018-11-22 ENCOUNTER — Ambulatory Visit: Payer: PRIVATE HEALTH INSURANCE | Admitting: Psychiatry

## 2018-12-03 ENCOUNTER — Ambulatory Visit (INDEPENDENT_AMBULATORY_CARE_PROVIDER_SITE_OTHER): Payer: PRIVATE HEALTH INSURANCE | Admitting: Psychology

## 2018-12-03 DIAGNOSIS — F332 Major depressive disorder, recurrent severe without psychotic features: Secondary | ICD-10-CM

## 2018-12-09 ENCOUNTER — Ambulatory Visit (INDEPENDENT_AMBULATORY_CARE_PROVIDER_SITE_OTHER): Payer: PRIVATE HEALTH INSURANCE | Admitting: Psychology

## 2018-12-09 DIAGNOSIS — F332 Major depressive disorder, recurrent severe without psychotic features: Secondary | ICD-10-CM | POA: Diagnosis not present

## 2018-12-20 ENCOUNTER — Ambulatory Visit: Payer: PRIVATE HEALTH INSURANCE | Admitting: Psychology

## 2019-01-04 ENCOUNTER — Ambulatory Visit (INDEPENDENT_AMBULATORY_CARE_PROVIDER_SITE_OTHER): Payer: PRIVATE HEALTH INSURANCE | Admitting: Psychology

## 2019-01-04 DIAGNOSIS — F332 Major depressive disorder, recurrent severe without psychotic features: Secondary | ICD-10-CM

## 2019-01-12 ENCOUNTER — Ambulatory Visit (INDEPENDENT_AMBULATORY_CARE_PROVIDER_SITE_OTHER): Payer: PRIVATE HEALTH INSURANCE | Admitting: Psychology

## 2019-01-12 DIAGNOSIS — F332 Major depressive disorder, recurrent severe without psychotic features: Secondary | ICD-10-CM

## 2019-01-17 ENCOUNTER — Other Ambulatory Visit: Payer: Self-pay | Admitting: Psychiatry

## 2019-01-17 ENCOUNTER — Telehealth: Payer: Self-pay | Admitting: Psychiatry

## 2019-01-17 ENCOUNTER — Ambulatory Visit (INDEPENDENT_AMBULATORY_CARE_PROVIDER_SITE_OTHER): Payer: PRIVATE HEALTH INSURANCE | Admitting: Psychology

## 2019-01-17 DIAGNOSIS — F411 Generalized anxiety disorder: Secondary | ICD-10-CM

## 2019-01-17 DIAGNOSIS — F429 Obsessive-compulsive disorder, unspecified: Secondary | ICD-10-CM | POA: Diagnosis not present

## 2019-01-17 MED ORDER — ALPRAZOLAM 0.5 MG PO TABS: 0.5000 mg | ORAL_TABLET | Freq: Every day | ORAL | 0 refills | Status: DC | PRN

## 2019-01-17 NOTE — Telephone Encounter (Signed)
Pt. Made aware.

## 2019-01-17 NOTE — Telephone Encounter (Signed)
Pt has lost her bottle for xanax. She has traveled and don't know if she lost them then. She said she may have had about 13 left. Pt is having a hard time without them. Please send to [facility] on [address].

## 2019-01-17 NOTE — Telephone Encounter (Signed)
Last saw MD in August.  Has upcoming appointment in January.  We will okay refill to for alprazolam.

## 2019-01-17 NOTE — Telephone Encounter (Signed)
Please let her know prescription for alprazolam or Xanax was sent to her [facility]

## 2019-01-24 ENCOUNTER — Ambulatory Visit (INDEPENDENT_AMBULATORY_CARE_PROVIDER_SITE_OTHER): Payer: PRIVATE HEALTH INSURANCE | Admitting: Psychology

## 2019-01-24 DIAGNOSIS — F411 Generalized anxiety disorder: Secondary | ICD-10-CM | POA: Diagnosis not present

## 2019-01-24 DIAGNOSIS — F429 Obsessive-compulsive disorder, unspecified: Secondary | ICD-10-CM | POA: Diagnosis not present

## 2019-02-09 ENCOUNTER — Ambulatory Visit (INDEPENDENT_AMBULATORY_CARE_PROVIDER_SITE_OTHER): Payer: 59 | Admitting: Psychology

## 2019-02-09 DIAGNOSIS — F411 Generalized anxiety disorder: Secondary | ICD-10-CM | POA: Diagnosis not present

## 2019-02-09 DIAGNOSIS — F429 Obsessive-compulsive disorder, unspecified: Secondary | ICD-10-CM | POA: Diagnosis not present

## 2019-02-10 ENCOUNTER — Ambulatory Visit: Payer: PRIVATE HEALTH INSURANCE | Admitting: Psychiatry

## 2019-02-14 ENCOUNTER — Ambulatory Visit (INDEPENDENT_AMBULATORY_CARE_PROVIDER_SITE_OTHER): Payer: 59 | Admitting: Psychology

## 2019-02-14 DIAGNOSIS — F429 Obsessive-compulsive disorder, unspecified: Secondary | ICD-10-CM | POA: Diagnosis not present

## 2019-02-14 DIAGNOSIS — F332 Major depressive disorder, recurrent severe without psychotic features: Secondary | ICD-10-CM

## 2019-02-17 ENCOUNTER — Telehealth: Payer: Self-pay | Admitting: Psychiatry

## 2019-02-17 ENCOUNTER — Other Ambulatory Visit: Payer: Self-pay

## 2019-02-17 MED ORDER — SERTRALINE HCL 100 MG PO TABS: 200.0000 mg | ORAL_TABLET | Freq: Every day | ORAL | 0 refills | Status: DC

## 2019-02-17 NOTE — Telephone Encounter (Signed)
Rx for Sertraline 200 mg submitted to [facility].

## 2019-02-17 NOTE — Telephone Encounter (Signed)
Pt called to request refill for Sertraline 100 mg  2/d @ [facility] [address]. Next appt 2/17

## 2019-02-21 ENCOUNTER — Ambulatory Visit (INDEPENDENT_AMBULATORY_CARE_PROVIDER_SITE_OTHER): Payer: 59 | Admitting: Psychology

## 2019-02-21 DIAGNOSIS — F429 Obsessive-compulsive disorder, unspecified: Secondary | ICD-10-CM | POA: Diagnosis not present

## 2019-02-21 DIAGNOSIS — F411 Generalized anxiety disorder: Secondary | ICD-10-CM | POA: Diagnosis not present

## 2019-02-28 ENCOUNTER — Ambulatory Visit: Payer: PRIVATE HEALTH INSURANCE | Admitting: Psychology

## 2019-03-15 ENCOUNTER — Ambulatory Visit (INDEPENDENT_AMBULATORY_CARE_PROVIDER_SITE_OTHER): Payer: 59 | Admitting: Psychology

## 2019-03-15 DIAGNOSIS — F411 Generalized anxiety disorder: Secondary | ICD-10-CM

## 2019-03-15 DIAGNOSIS — F429 Obsessive-compulsive disorder, unspecified: Secondary | ICD-10-CM

## 2019-03-16 ENCOUNTER — Other Ambulatory Visit: Payer: Self-pay

## 2019-03-16 ENCOUNTER — Ambulatory Visit (INDEPENDENT_AMBULATORY_CARE_PROVIDER_SITE_OTHER): Payer: 59 | Admitting: Psychiatry

## 2019-03-16 ENCOUNTER — Encounter: Payer: Self-pay | Admitting: Psychiatry

## 2019-03-16 DIAGNOSIS — F331 Major depressive disorder, recurrent, moderate: Secondary | ICD-10-CM | POA: Diagnosis not present

## 2019-03-16 DIAGNOSIS — F4001 Agoraphobia with panic disorder: Secondary | ICD-10-CM

## 2019-03-16 DIAGNOSIS — F422 Mixed obsessional thoughts and acts: Secondary | ICD-10-CM | POA: Diagnosis not present

## 2019-03-16 NOTE — Progress Notes (Signed)
Gina Gillespie [id number] 1990/04/10 29 y.o.  Subjective:   Patient ID:  Gina Gillespie is a 29 y.o. (DOB [birth year]) female.  Chief Complaint:  Chief Complaint  Patient presents with  . Anxiety    OCD FU  . Depression    Depression        Associated symptoms include no decreased concentration and no suicidal ideas.  Gina Gillespie presents to the office today for follow-up of OCDand recent hospitalization for SI DT marital crisis.   seen July 26, 2018.  She was pregnant and had reduced sertraline to 200 mg daily.  Last seen August 2020.  He was [redacted] weeks pregnant taking sertraline.  She was in a marital crisis.  No meds were changed.  Due date was March 04, 2019  Delivery Feb 1, ivey.   Doing well.  Delivery uneventful at home.  Husband caught her and was good. Doing OK.  Still a lot going on.  New counselor and need to still work through things.  Through [facility] in [place].    Anxiety and OCD fair.  No Xanax since December panic over issues with husband.  Has had Obsessive thoughts over that. Also some postpartum depression.  Could be a lot worse.  Part of me doesn't have time to freak out bc so busy.  Anxiety and OCD worse postpartum at night mainly.  After 2 other kids she handles it better.  Trying to intentionally enjoy the baby.  Better handling the intrusive thoughts that she's a bad mom and children would be better off without her.  Preparing for RTW in 3 weeks and not sure about care of the kids.  Good function.  Had SI 3rd night after delivery but not since.  Breast feeding without problems.  Ball working new job nights.  Had been sleeping separately in the same house.  Up to him if they stay together or separate. He's in counseling also.  OK but not great.  Fine with meds.  Seeing therapist in [place] and she and H see Dr. Cheryln Manly for couples.   Hard being alone.  Not sure what will happen.  Doesn't like being alone but handling it better than in the past.   [name]'s mother is coming to help her and she's ok with that.  Still some depression to a moderate degree witht the situation.  denies  irritable moods.  Patient denies any recent difficulty with anxiety, usually.  OCD managed well-enough generally. Patient denies difficulty with sleep initiation or maintenance. Denies appetite disturbance.  Patient reports that energy and motivation have been good.  Patient denies any difficulty with concentration.  Patient denies any suicidal ideation.  Some hopeless feelings.  Youngest child may interfere with sleep.  2 kids.  Took sertraline 200 while pregnant with [name] but still had ppd.  Disc OB concerns about the dosage and wondered about switching to Wellbutrin.  She was admitted voluntarily on June 8 and discharged on June 10 from [facility] behavioral health.  She was pregnant and it appears sertraline was reduced to 200mg  from 300 mg.  Past Psychiatric Medication Trials:   Sertraline 300, Lexapro, Xanax   Review of Systems:  Review of Systems  Constitutional:       Night sweats   Gastrointestinal: Negative for nausea.  Neurological: Negative for tremors and weakness.  Psychiatric/Behavioral: Positive for depression. Negative for agitation, behavioral problems, confusion, decreased concentration, dysphoric mood, hallucinations, self-injury, sleep disturbance and suicidal ideas. The patient is nervous/anxious. The patient is  not hyperactive.     Medications: I have reviewed the patient's current medications.  Current Outpatient Medications  Medication Sig Dispense Refill  . ALPRAZolam (XANAX) 0.5 MG tablet Take 1 tablet (0.5 mg total) by mouth daily as needed for anxiety. 20 tablet 0  . Docosahexaenoic Acid (PRENATAL DHA PO) Take by mouth.    . sertraline (ZOLOFT) 100 MG tablet Take 2 tablets (200 mg total) by mouth daily. 60 tablet 0  . Prenat Vit-Fe Gly Cys-FA-Omega (ENBRACE HR) CAPS Take 1 capsule by mouth daily. (Patient not taking: Reported  on 03/16/2019) 90 capsule 2   No current facility-administered medications for this visit.    Medication Side Effects: Other: reduced libido and weight gain and night sweats  Allergies: No Known Allergies  Past Medical History:  Diagnosis Date  . Anxiety   . Depression   . History of chicken pox   . OCD (obsessive compulsive disorder)     Family History  Problem Relation Age of Onset  . Alcohol abuse Father   . Cancer Father   . Depression Father   . Mental illness Father   . Throat cancer Father   . Mental illness Brother   . Arthritis Maternal Grandmother   . Depression Maternal Grandmother   . Diabetes Maternal Grandmother   . Hearing loss Maternal Grandmother   . Hypertension Maternal Grandmother   . Alcohol abuse Maternal Grandfather   . Cancer Maternal Grandfather   . Early death Paternal Grandmother   . Alcohol abuse Paternal Grandfather   . Cancer Paternal Grandfather     Social History   Socioeconomic History  . Marital status: Married    Spouse name: Not on file  . Number of children: Not on file  . Years of education: Not on file  . Highest education level: Not on file  Occupational History  . Not on file  Tobacco Use  . Smoking status: Never Smoker  . Smokeless tobacco: Never Used  Substance and Sexual Activity  . Alcohol use: Never  . Drug use: Never  . Sexual activity: Yes    Partners: Male    Birth control/protection: Condom    Comment: did not use condoms consistently  Other Topics Concern  . Not on file  Social History Narrative  . Not on file   Social Determinants of Health   Financial Resource Strain:   . Difficulty of Paying Living Expenses: Not on file  Food Insecurity: No Food Insecurity  . Worried About Programme researcher, broadcasting/film/video in the Last Year: Never true  . Ran Out of Food in the Last Year: Never true  Transportation Needs: No Transportation Needs  . Lack of Transportation (Medical): No  . Lack of Transportation (Non-Medical):  No  Physical Activity:   . Days of Exercise per Week: Not on file  . Minutes of Exercise per Session: Not on file  Stress:   . Feeling of Stress : Not on file  Social Connections:   . Frequency of Communication with Friends and Family: Not on file  . Frequency of Social Gatherings with Friends and Family: Not on file  . Attends Religious Services: Not on file  . Active Member of Clubs or Organizations: Not on file  . Attends Banker Meetings: Not on file  . Marital Status: Not on file  Intimate Partner Violence:   . Fear of Current or Ex-Partner: Not on file  . Emotionally Abused: Not on file  . Physically Abused: Not on  file  . Sexually Abused: Not on file    Past Medical History, Surgical history, Social history, and Family history were reviewed and updated as appropriate.   Please see review of systems for further details on the patient's review from today.   Objective:   Physical Exam:  LMP  (LMP Unknown)   Physical Exam Constitutional:      General: She is not in acute distress.    Appearance: She is well-developed.  Musculoskeletal:        General: No deformity.  Neurological:     Mental Status: She is alert and oriented to person, place, and time.     Motor: No tremor.     Coordination: Coordination normal.     Gait: Gait normal.  Psychiatric:        Attention and Perception: Attention normal. She is attentive.        Mood and Affect: Mood is anxious and depressed. Affect is not labile, blunt, angry or inappropriate.        Speech: Speech normal.        Behavior: Behavior normal.        Thought Content: Thought content normal. Thought content does not include homicidal or suicidal ideation. Thought content does not include homicidal or suicidal plan.        Cognition and Memory: Cognition normal.        Judgment: Judgment normal.     Comments: Insight is good. Anxiety and depression still there but manageable.     Lab Review:     Component  Value Date/Time   NA 138 07/06/2018 0651   K 3.9 07/06/2018 0651   CL 106 07/06/2018 0651   CO2 25 07/06/2018 0651   GLUCOSE 99 07/06/2018 0651   BUN 10 07/06/2018 0651   CREATININE 0.61 07/06/2018 0651   CALCIUM 8.7 (L) 07/06/2018 0651   PROT 6.5 07/06/2018 0651   ALBUMIN 3.9 07/06/2018 0651   AST 16 07/06/2018 0651   ALT 17 07/06/2018 0651   ALKPHOS 52 07/06/2018 0651   BILITOT 0.5 07/06/2018 0651   GFRNONAA >60 07/06/2018 0651   GFRAA >60 07/06/2018 0651       Component Value Date/Time   WBC 6.7 07/06/2018 0651   RBC 4.14 07/06/2018 0651   HGB 12.4 07/06/2018 0651   HCT 38.5 07/06/2018 0651   PLT 235 07/06/2018 0651   MCV 93.0 07/06/2018 0651   MCH 30.0 07/06/2018 0651   MCHC 32.2 07/06/2018 0651   RDW 12.8 07/06/2018 0651    No results found for: POCLITH, LITHIUM   No results found for: PHENYTOIN, PHENOBARB, VALPROATE, CBMZ   .res Assessment: Plan:    Gina Gillespie was seen today for anxiety and depression.  Diagnoses and all orders for this visit:  Mixed obsessional thoughts and acts  Panic disorder with agoraphobia  Major depressive disorder, recurrent episode, moderate (HCC)  Greater than 50% of face to face time with patient was spent on counseling and coordination of care. We discussed the following. Patient with recent marital crisis leading to suicidal thoughts and a hospitalization.  Suicidal thoughts have resolved.  Her depression has somewhat worsened since the last visit because of the circumstances are with worse.  She is managing.  She does not want a change in medicines.  Discussed the pros and cons of serotonin medicines in nursing and the potential unknowns as well affecting the baby.  She clearly has severe OCD which will certainly worsen if she stops the medication.  She  had a recent depression with suicidal thoughts.  The risk of stopping the medication exceeds the benefits.  The dosage has been reduced in an attempt to reduce any potential  theoretical risk to the baby.  She agrees to continue the medication as is.  Supportive therapy dealing with marital crisis.   And possible therapists and how to deal with the crisis in the interim. Has prexisting sexual issues she needs to address also.  Disc risk Xanax for panic in pregnancy.  She is using it very rarely wants every few weeks.  We discussed the short-term risks associated with benzodiazepines including sedation and increased fall risk among others.  Discussed long-term side effect risk including dependence, potential withdrawal symptoms, and the potential eventual dose-related risk of dementia.  No med change indicated.  This appt was 30 mins.  FU 2-3 mo  Meredith Staggers, MD, DFAPA    Please see After Visit Summary for patient specific instructions.  Future Appointments  Date Time Provider Department Center  03/21/2019  3:00 PM Haze Rushing, PhD [facility] None  03/30/2019  5:00 PM Haze Rushing, PhD [facility] None  04/05/2019  4:00 PM Haze Rushing, PhD [facility] None    No orders of the defined types were placed in this encounter.     -------------------------------

## 2019-03-21 ENCOUNTER — Ambulatory Visit (INDEPENDENT_AMBULATORY_CARE_PROVIDER_SITE_OTHER): Payer: 59 | Admitting: Psychology

## 2019-03-21 DIAGNOSIS — F411 Generalized anxiety disorder: Secondary | ICD-10-CM | POA: Diagnosis not present

## 2019-03-21 DIAGNOSIS — F429 Obsessive-compulsive disorder, unspecified: Secondary | ICD-10-CM

## 2019-03-30 ENCOUNTER — Ambulatory Visit (INDEPENDENT_AMBULATORY_CARE_PROVIDER_SITE_OTHER): Payer: 59 | Admitting: Psychology

## 2019-03-30 DIAGNOSIS — F411 Generalized anxiety disorder: Secondary | ICD-10-CM | POA: Diagnosis not present

## 2019-03-30 DIAGNOSIS — F429 Obsessive-compulsive disorder, unspecified: Secondary | ICD-10-CM

## 2019-04-05 ENCOUNTER — Ambulatory Visit (INDEPENDENT_AMBULATORY_CARE_PROVIDER_SITE_OTHER): Payer: 59 | Admitting: Psychology

## 2019-04-05 DIAGNOSIS — F411 Generalized anxiety disorder: Secondary | ICD-10-CM | POA: Diagnosis not present

## 2019-04-05 DIAGNOSIS — F429 Obsessive-compulsive disorder, unspecified: Secondary | ICD-10-CM | POA: Diagnosis not present

## 2019-04-08 ENCOUNTER — Ambulatory Visit: Payer: PRIVATE HEALTH INSURANCE | Admitting: Family Medicine

## 2019-04-08 ENCOUNTER — Telehealth: Payer: Self-pay | Admitting: Psychiatry

## 2019-04-08 NOTE — Telephone Encounter (Signed)
Gina Gillespie wanting to request a letter for work for her STD.  She wouldn't give me details, wants to discuss with you.  Please call.

## 2019-04-10 ENCOUNTER — Other Ambulatory Visit: Payer: Self-pay | Admitting: Psychiatry

## 2019-05-24 ENCOUNTER — Ambulatory Visit (INDEPENDENT_AMBULATORY_CARE_PROVIDER_SITE_OTHER): Payer: 59 | Admitting: Psychology

## 2019-05-24 DIAGNOSIS — F429 Obsessive-compulsive disorder, unspecified: Secondary | ICD-10-CM | POA: Diagnosis not present

## 2019-05-24 DIAGNOSIS — F411 Generalized anxiety disorder: Secondary | ICD-10-CM

## 2019-06-15 ENCOUNTER — Ambulatory Visit (INDEPENDENT_AMBULATORY_CARE_PROVIDER_SITE_OTHER): Payer: 59 | Admitting: Psychology

## 2019-06-15 DIAGNOSIS — F429 Obsessive-compulsive disorder, unspecified: Secondary | ICD-10-CM

## 2019-06-15 DIAGNOSIS — F411 Generalized anxiety disorder: Secondary | ICD-10-CM | POA: Diagnosis not present

## 2019-07-11 ENCOUNTER — Ambulatory Visit (INDEPENDENT_AMBULATORY_CARE_PROVIDER_SITE_OTHER): Payer: No Typology Code available for payment source | Admitting: Psychology

## 2019-07-11 DIAGNOSIS — F429 Obsessive-compulsive disorder, unspecified: Secondary | ICD-10-CM

## 2019-07-11 DIAGNOSIS — F411 Generalized anxiety disorder: Secondary | ICD-10-CM | POA: Diagnosis not present

## 2019-08-09 ENCOUNTER — Ambulatory Visit (INDEPENDENT_AMBULATORY_CARE_PROVIDER_SITE_OTHER): Payer: No Typology Code available for payment source | Admitting: Psychology

## 2019-08-09 DIAGNOSIS — F411 Generalized anxiety disorder: Secondary | ICD-10-CM | POA: Diagnosis not present

## 2019-08-09 DIAGNOSIS — F429 Obsessive-compulsive disorder, unspecified: Secondary | ICD-10-CM

## 2019-08-11 ENCOUNTER — Telehealth (INDEPENDENT_AMBULATORY_CARE_PROVIDER_SITE_OTHER): Payer: No Typology Code available for payment source | Admitting: Family Medicine

## 2019-08-11 ENCOUNTER — Encounter: Payer: Self-pay | Admitting: Family Medicine

## 2019-08-11 VITALS — Ht 66.0 in | Wt 152.0 lb

## 2019-08-11 DIAGNOSIS — J0100 Acute maxillary sinusitis, unspecified: Secondary | ICD-10-CM | POA: Diagnosis not present

## 2019-08-11 MED ORDER — FLUTICASONE PROPIONATE 50 MCG/ACT NA SUSP: 2.0000 | Freq: Every day | NASAL | 6 refills | Status: DC

## 2019-08-11 MED ORDER — AMOXICILLIN-POT CLAVULANATE 875-125 MG PO TABS: 1.0000 | ORAL_TABLET | Freq: Two times a day (BID) | ORAL | 0 refills | Status: DC

## 2019-08-11 NOTE — Progress Notes (Signed)
Patient: Gina Gillespie MRN: [id number] DOB: 1990/03/18 PCP: Orland Mustard, MD     I connected with [name] on 08/11/19 at 10:16am  by a video enabled telemedicine application and verified that I am speaking with the correct person using two identifiers.  Location patient: Home Location provider: [facility], Office Persons participating in this virtual visit: [name] and Dr. Artis Flock   I discussed the limitations of evaluation and management by telemedicine and the availability of in person appointments. The patient expressed understanding and agreed to proceed.   Subjective:  Chief Complaint  Patient presents with  . Nasal Congestion    Starting Sunday.  . Sinus Problem    HPI: The patient is a 29 y.o. female who presents today for sinus pressure. She woke up Sunday with a sore throat. She felt sick Monday and Tuesday and has been getting progressively worse. She has a lot of sinus pressure in her head, lots of mucous, decreased appetite and her right ear has started to hurt. She says that she has a lot of mucus and nasal congestion. No fever/chills. She is very fatigued. Her son had mild congestion on Monday and her daughter stayed home on Tuesday with a runny nose, but other than that no sick contacts. She denies any pain in her teeth. Her nasal mucous has smell and can be thick and yellow. She has a cough, but due to drainage. No shortness of breath or wheezing. No known covid contacts. She is still breast feeding.    Review of Systems  Constitutional: Positive for fatigue. Negative for chills and fever.  HENT: Positive for congestion, ear pain, rhinorrhea, sinus pressure and sneezing. Negative for ear discharge, sinus pain and sore throat.   Respiratory: Negative for chest tightness, shortness of breath and wheezing.   Gastrointestinal: Negative for abdominal pain, diarrhea, nausea and vomiting.  Neurological: Positive for headaches. Negative for dizziness and  light-headedness.    Allergies Patient has No Known Allergies.  Past Medical History Patient  has a past medical history of Anxiety, Depression, History of chicken pox, and OCD (obsessive compulsive disorder).  Surgical History Patient  has no past surgical history on file.  Family History Pateint's family history includes Alcohol abuse in her father, maternal grandfather, and paternal grandfather; Arthritis in her maternal grandmother; Cancer in her father, maternal grandfather, and paternal grandfather; Depression in her father and maternal grandmother; Diabetes in her maternal grandmother; Early death in her paternal grandmother; Hearing loss in her maternal grandmother; Hypertension in her maternal grandmother; Mental illness in her brother and father; Throat cancer in her father.  Social History Patient  reports that she has never smoked. She has never used smokeless tobacco. She reports that she does not drink alcohol and does not use drugs.    Objective: Vitals:   08/11/19 0945  Weight: 152 lb (68.9 kg)  Height: 5\' 6"  (1.676 m)    Body mass index is 24.53 kg/m.  Physical Exam Vitals reviewed.  Constitutional:      General: She is not in acute distress.    Appearance: Normal appearance. She is normal weight. She is not ill-appearing.  HENT:     Head: Normocephalic and atraumatic.     Comments: No TTP when she presses on her frontal or maxillary sinuses. Sounds congested.     Nose: Congestion present.  Pulmonary:     Effort: Pulmonary effort is normal.  Neurological:     General: No focal deficit present.     Mental  Status: She is alert and oriented to person, place, and time.  Psychiatric:        Mood and Affect: Mood normal.        Behavior: Behavior normal.        Assessment/plan: 1. Acute non-recurrent maxillary sinusitis -has only been 5 days with no fever, we are going to continue to try conservative therapy especially since she is still breastfeeding her  21.1 month old baby. Recommended otc flonase and a cool mist humidifier. If symptoms persist past 7-10 days or worsening symptoms/fever she is to start the augmentin I sent in for her. Discussed okay with breast feeding, but can cause diarrhea or thrush in baby and to watch for this. She can let me know if any questions or issues.     Return if symptoms worsen or fail to improve.     Orland Mustard, MD [facility] [place]  08/11/2019

## 2019-08-15 ENCOUNTER — Ambulatory Visit: Payer: No Typology Code available for payment source | Admitting: Psychology

## 2019-08-29 ENCOUNTER — Ambulatory Visit: Payer: No Typology Code available for payment source | Admitting: Psychology

## 2019-08-30 ENCOUNTER — Encounter: Payer: No Typology Code available for payment source | Admitting: Family Medicine

## 2019-08-30 ENCOUNTER — Telehealth (INDEPENDENT_AMBULATORY_CARE_PROVIDER_SITE_OTHER): Payer: HRSA Program | Admitting: Family Medicine

## 2019-08-30 ENCOUNTER — Telehealth: Payer: Self-pay | Admitting: Family Medicine

## 2019-08-30 ENCOUNTER — Encounter: Payer: Self-pay | Admitting: Family Medicine

## 2019-08-30 DIAGNOSIS — B342 Coronavirus infection, unspecified: Secondary | ICD-10-CM

## 2019-08-30 NOTE — Telephone Encounter (Signed)
Lvm for pt to call the office back. 

## 2019-08-30 NOTE — Patient Instructions (Addendum)
Follow up: with your doctor in 3-5 days   Self Isolation/Home Quarantine: -see the CDC site for information:   DiscoHelp.si.html   -stay home except for to seek medical care -stay in your own room away from others in your house and use a separate bathroom if possible -Wash hands frequently, disinfect high touch surface areas often, wear a mask if you leave your room and interact as little as possible with others -seek medical care immediately if worsening - call our office for a visit or call ahead if going elsewhere to an urgent care  -seek emergency care if very sick or severe symptoms - call 911 -isolate for at least 10 days from the onset of symptoms PLUS 1 day of no fever PLUS 1 days of improving symptoms       COVID-19 COVID-19 is a respiratory infection that is caused by a virus called severe acute respiratory syndrome coronavirus 2 (SARS-CoV-2). The disease is also known as coronavirus disease or novel coronavirus. In some people, the virus may not cause any symptoms. In others, it may cause a serious infection. The infection can get worse quickly and can lead to complications, such as:  Pneumonia, or infection of the lungs.  Acute respiratory distress syndrome or ARDS. This is a condition in which fluid build-up in the lungs prevents the lungs from filling with air and passing oxygen into the blood.  Acute respiratory failure. This is a condition in which there is not enough oxygen passing from the lungs to the body or when carbon dioxide is not passing from the lungs out of the body.  Sepsis or septic shock. This is a serious bodily reaction to an infection.  Blood clotting problems.  Secondary infections due to bacteria or fungus.  Organ failure. This is when your body's organs stop working. The virus that causes COVID-19 is contagious. This means that it can spread from person to person through droplets from  coughs and sneezes (respiratory secretions). What are the causes? This illness is caused by a virus. You may catch the virus by:  Breathing in droplets from an infected person. Droplets can be spread by a person breathing, speaking, singing, coughing, or sneezing.  Touching something, like a table or a doorknob, that was exposed to the virus (contaminated) and then touching your mouth, nose, or eyes. What increases the risk? Risk for infection You are more likely to be infected with this virus if you:  Are within 6 feet (2 meters) of a person with COVID-19.  Provide care for or live with a person who is infected with COVID-19.  Spend time in crowded indoor spaces or live in shared housing. Risk for serious illness You are more likely to become seriously ill from the virus if you:  Are 65 years of age or older. The higher your age, the more you are at risk for serious illness.  Live in a nursing home or long-term care facility.  Have cancer.  Have a long-term (chronic) disease such as: ? Chronic lung disease, including chronic obstructive pulmonary disease or asthma. ? A long-term disease that lowers your body's ability to fight infection (immunocompromised). ? Heart disease, including heart failure, a condition in which the arteries that lead to the heart become narrow or blocked (coronary artery disease), a disease which makes the heart muscle thick, weak, or stiff (cardiomyopathy). ? Diabetes. ? Chronic kidney disease. ? Sickle cell disease, a condition in which red blood cells have an abnormal "sickle" shape. ?  Liver disease.  Are obese. What are the signs or symptoms? Symptoms of this condition can range from mild to severe. Symptoms may appear any time from 2 to 14 days after being exposed to the virus. They include:  A fever or chills.  A cough.  Difficulty breathing.  Headaches, body aches, or muscle aches.  Runny or stuffy (congested) nose.  A sore  throat.  New loss of taste or smell. Some people may also have stomach problems, such as nausea, vomiting, or diarrhea. Other people may not have any symptoms of COVID-19. How is this diagnosed? This condition may be diagnosed based on:  Your signs and symptoms, especially if: ? You live in an area with a COVID-19 outbreak. ? You recently traveled to or from an area where the virus is common. ? You provide care for or live with a person who was diagnosed with COVID-19. ? You were exposed to a person who was diagnosed with COVID-19.  A physical exam.  Lab tests, which may include: ? Taking a sample of fluid from the back of your nose and throat (nasopharyngeal fluid), your nose, or your throat using a swab. ? A sample of mucus from your lungs (sputum). ? Blood tests.  Imaging tests, which may include, X-rays, CT scan, or ultrasound. How is this treated? At present, there is no medicine to treat COVID-19. Medicines that treat other diseases are being used on a trial basis to see if they are effective against COVID-19. Your health care provider will talk with you about ways to treat your symptoms. For most people, the infection is mild and can be managed at home with rest, fluids, and over-the-counter medicines. Treatment for a serious infection usually takes places in a hospital intensive care unit (ICU). It may include one or more of the following treatments. These treatments are given until your symptoms improve.  Receiving fluids and medicines through an IV.  Supplemental oxygen. Extra oxygen is given through a tube in the nose, a face mask, or a hood.  Positioning you to lie on your stomach (prone position). This makes it easier for oxygen to get into the lungs.  Continuous positive airway pressure (CPAP) or bi-level positive airway pressure (BPAP) machine. This treatment uses mild air pressure to keep the airways open. A tube that is connected to a motor delivers oxygen to the  body.  Ventilator. This treatment moves air into and out of the lungs by using a tube that is placed in your windpipe.  Tracheostomy. This is a procedure to create a hole in the neck so that a breathing tube can be inserted.  Extracorporeal membrane oxygenation (ECMO). This procedure gives the lungs a chance to recover by taking over the functions of the heart and lungs. It supplies oxygen to the body and removes carbon dioxide. Follow these instructions at home: Lifestyle  If you are sick, stay home except to get medical care. Your health care provider will tell you how long to stay home. Call your health care provider before you go for medical care.  Rest at home as told by your health care provider.  Do not use any products that contain nicotine or tobacco, such as cigarettes, e-cigarettes, and chewing tobacco. If you need help quitting, ask your health care provider.  Return to your normal activities as told by your health care provider. Ask your health care provider what activities are safe for you. General instructions  Take over-the-counter and prescription medicines only as told  by your health care provider.  Drink enough fluid to keep your urine pale yellow.  Keep all follow-up visits as told by your health care provider. This is important. How is this prevented?  There is no vaccine to help prevent COVID-19 infection. However, there are steps you can take to protect yourself and others from this virus. To protect yourself:   Do not travel to areas where COVID-19 is a risk. The areas where COVID-19 is reported change often. To identify high-risk areas and travel restrictions, check the CDC travel website: StageSync.si  If you live in, or must travel to, an area where COVID-19 is a risk, take precautions to avoid infection. ? Stay away from people who are sick. ? Wash your hands often with soap and water for 20 seconds. If soap and water are not available, use  an alcohol-based hand sanitizer. ? Avoid touching your mouth, face, eyes, or nose. ? Avoid going out in public, follow guidance from your state and local health authorities. ? If you must go out in public, wear a cloth face covering or face mask. Make sure your mask covers your nose and mouth. ? Avoid crowded indoor spaces. Stay at least 6 feet (2 meters) away from others. ? Disinfect objects and surfaces that are frequently touched every day. This may include:  Counters and tables.  Doorknobs and light switches.  Sinks and faucets.  Electronics, such as phones, remote controls, keyboards, computers, and tablets. To protect others: If you have symptoms of COVID-19, take steps to prevent the virus from spreading to others.  If you think you have a COVID-19 infection, contact your health care provider right away. Tell your health care team that you think you may have a COVID-19 infection.  Stay home. Leave your house only to seek medical care. Do not use public transport.  Do not travel while you are sick.  Wash your hands often with soap and water for 20 seconds. If soap and water are not available, use alcohol-based hand sanitizer.  Stay away from other members of your household. Let healthy household members care for children and pets, if possible. If you have to care for children or pets, wash your hands often and wear a mask. If possible, stay in your own room, separate from others. Use a different bathroom.  Make sure that all people in your household wash their hands well and often.  Cough or sneeze into a tissue or your sleeve or elbow. Do not cough or sneeze into your hand or into the air.  Wear a cloth face covering or face mask. Make sure your mask covers your nose and mouth. Where to find more information  Centers for Disease Control and Prevention: StickerEmporium.tn  World Health Organization: https://thompson-craig.com/ Contact a  health care provider if:  You live in or have traveled to an area where COVID-19 is a risk and you have symptoms of the infection.  You have had contact with someone who has COVID-19 and you have symptoms of the infection. Get help right away if:  You have trouble breathing.  You have pain or pressure in your chest.  You have confusion.  You have bluish lips and fingernails.  You have difficulty waking from sleep.  You have symptoms that get worse. These symptoms may represent a serious problem that is an emergency. Do not wait to see if the symptoms will go away. Get medical help right away. Call your local emergency services (911 in the [place] Do  not drive yourself to the hospital. Let the emergency medical personnel know if you think you have COVID-19. Summary  COVID-19 is a respiratory infection that is caused by a virus. It is also known as coronavirus disease or novel coronavirus. It can cause serious infections, such as pneumonia, acute respiratory distress syndrome, acute respiratory failure, or sepsis.  The virus that causes COVID-19 is contagious. This means that it can spread from person to person through droplets from breathing, speaking, singing, coughing, or sneezing.  You are more likely to develop a serious illness if you are 57 years of age or older, have a weak immune system, live in a nursing home, or have chronic disease.  There is no medicine to treat COVID-19. Your health care provider will talk with you about ways to treat your symptoms.  Take steps to protect yourself and others from infection. Wash your hands often and disinfect objects and surfaces that are frequently touched every day. Stay away from people who are sick and wear a mask if you are sick. This information is not intended to replace advice given to you by your health care provider. Make sure you discuss any questions you have with your health care provider. Document Revised: 11/12/2018 Document  Reviewed: 02/18/2018 Elsevier Patient Education  2020 [employer].

## 2019-08-30 NOTE — Progress Notes (Addendum)
Virtual Visit via Video Note  I connected with Gina Gillespie  on 08/30/19 at  3:00 PM EDT by a video enabled telemedicine application and verified that I am speaking with the correct person using two identifiers.  Location patient: home, [place] Location provider:work or home office Persons participating in the virtual visit: patient, provider  I discussed the limitations of evaluation and management by telemedicine and the availability of in person appointments. The patient expressed understanding and agreed to proceed.   HPI:  Acute visit for COVID19: -had an upper resp illness in the middle of July, reports saw her PCP and was told did not need COVID test, symptoms resolved -symptoms started again July 30th -symptoms include nasal congestion, sore throat, mild SOB briefly when got her test results which she thinks was anxiety, mild cough, body aches -denies fevers, NVD, current SOB, loss of taste or smell, lethargy - got a PCR COVID19 test 3 days ago for travel that was positive on results today; she repeated the test today which was positive again -has a 86 month old who is more fussy and not feeling well and who has a cough -not vaccinated for COVID19 -denies any high risk medical conditions  ROS: See pertinent positives and negatives per HPI.  Past Medical History:  Diagnosis Date  . Anxiety   . Depression   . History of chicken pox   . OCD (obsessive compulsive disorder)     History reviewed. No pertinent surgical history.  Family History  Problem Relation Age of Onset  . Alcohol abuse Father   . Cancer Father   . Depression Father   . Mental illness Father   . Throat cancer Father   . Mental illness Brother   . Arthritis Maternal Grandmother   . Depression Maternal Grandmother   . Diabetes Maternal Grandmother   . Hearing loss Maternal Grandmother   . Hypertension Maternal Grandmother   . Alcohol abuse Maternal Grandfather   . Cancer Maternal Grandfather   . Early death  Paternal Grandmother   . Alcohol abuse Paternal Grandfather   . Cancer Paternal Grandfather     SOCIAL HX: see hpi   Current Outpatient Medications:  .  Docosahexaenoic Acid (PRENATAL DHA PO), Take by mouth., Disp: , Rfl:  .  fluticasone (FLONASE) 50 MCG/ACT nasal spray, Place 2 sprays into both nostrils daily., Disp: 16 g, Rfl: 6 .  sertraline (ZOLOFT) 100 MG tablet, TAKE 2 TABLETS(200 MG) BY MOUTH DAILY, Disp: 60 tablet, Rfl: 3  EXAM:  VITALS per patient if applicable:  GENERAL: alert, oriented, appears well and in no acute distress  HEENT: atraumatic, conjunttiva clear, no obvious abnormalities on inspection of external nose and ears  NECK: normal movements of the head and neck  LUNGS: on inspection no signs of respiratory distress, breathing rate appears normal, no obvious gross SOB, gasping or wheezing  CV: no obvious cyanosis  MS: moves all visible extremities without noticeable abnormality  PSYCH/NEURO: pleasant and cooperative, no obvious depression or anxiety, speech and thought processing grossly intact  ASSESSMENT AND PLAN:  Discussed the following assessment and plan:  Coronavirus infection  -we discussed possible serious and likely etiologies, options for evaluation and workup, limitations of telemedicine visit vs in person visit, treatment, treatment risks and precautions. Pt prefers to treat via telemedicine empirically rather then risking or undertaking an in person visit at this moment. Discussed treatment options, home isolation, potential complications and precautions. She had many questions and spent over 20 minutes in counseling on  transmission, mask options, test options, treatment, supplements and isolation. She agrees to seek prompt  care if worsening, new symptoms arise, or if is not improving with treatment. She also is agreeable to a follow up with her PCP later this week or next week when PCP is available.   I discussed the assessment and treatment  plan with the patient. The patient was provided an opportunity to ask questions and all were answered. The patient agreed with the plan and demonstrated an understanding of the instructions.   The patient was advised to call back or seek an in-person evaluation if the symptoms worsen or if the condition fails to improve as anticipated.   Terressa Koyanagi, DO

## 2019-08-30 NOTE — Telephone Encounter (Signed)
Caller states she needs an appt today. She has a trip coming up and she has tested positive for covid. Caller states she needs another test and the [place] only accept the PCR test and she. Caller has had symptoms since Saturday

## 2019-08-30 NOTE — Telephone Encounter (Signed)
Patient states that she wanted something today, so scheduled patient with Dr.Kim today at 3pm.

## 2019-08-30 NOTE — Telephone Encounter (Signed)
Pt is requesting app to follow up for Covid symptoms. Pt has positive results.  Thank You

## 2019-08-31 NOTE — Progress Notes (Signed)
Patient is scheduled for a virtual visit next Thursday at 9.

## 2019-09-08 ENCOUNTER — Telehealth: Payer: No Typology Code available for payment source | Admitting: Family Medicine

## 2019-09-12 ENCOUNTER — Ambulatory Visit (INDEPENDENT_AMBULATORY_CARE_PROVIDER_SITE_OTHER): Payer: Self-pay | Admitting: Psychology

## 2019-09-12 DIAGNOSIS — F411 Generalized anxiety disorder: Secondary | ICD-10-CM

## 2019-09-12 DIAGNOSIS — F429 Obsessive-compulsive disorder, unspecified: Secondary | ICD-10-CM

## 2019-10-10 ENCOUNTER — Ambulatory Visit (INDEPENDENT_AMBULATORY_CARE_PROVIDER_SITE_OTHER): Payer: 59 | Admitting: Psychology

## 2019-10-10 DIAGNOSIS — F429 Obsessive-compulsive disorder, unspecified: Secondary | ICD-10-CM | POA: Diagnosis not present

## 2019-10-10 DIAGNOSIS — F411 Generalized anxiety disorder: Secondary | ICD-10-CM

## 2019-10-24 ENCOUNTER — Ambulatory Visit (INDEPENDENT_AMBULATORY_CARE_PROVIDER_SITE_OTHER): Payer: 59 | Admitting: Psychology

## 2019-10-24 DIAGNOSIS — F429 Obsessive-compulsive disorder, unspecified: Secondary | ICD-10-CM | POA: Diagnosis not present

## 2019-10-24 DIAGNOSIS — F411 Generalized anxiety disorder: Secondary | ICD-10-CM | POA: Diagnosis not present

## 2019-11-07 ENCOUNTER — Ambulatory Visit (INDEPENDENT_AMBULATORY_CARE_PROVIDER_SITE_OTHER): Payer: 59 | Admitting: Psychology

## 2019-11-07 DIAGNOSIS — F411 Generalized anxiety disorder: Secondary | ICD-10-CM | POA: Diagnosis not present

## 2019-11-07 DIAGNOSIS — F429 Obsessive-compulsive disorder, unspecified: Secondary | ICD-10-CM

## 2019-11-11 ENCOUNTER — Telehealth: Payer: Self-pay | Admitting: Psychiatry

## 2019-11-11 NOTE — Telephone Encounter (Signed)
Pt's next appt is 11/10. She called for a refill on Sertraline. Please call [facility] on [address], [place], [phone]

## 2019-11-14 ENCOUNTER — Other Ambulatory Visit: Payer: Self-pay

## 2019-11-14 MED ORDER — SERTRALINE HCL 100 MG PO TABS: ORAL_TABLET | ORAL | 1 refills | Status: DC

## 2019-11-14 NOTE — Telephone Encounter (Signed)
Rx for Sertraline 100 mg #60 sent to [facility], has apt 12/07/19

## 2019-11-21 ENCOUNTER — Ambulatory Visit (INDEPENDENT_AMBULATORY_CARE_PROVIDER_SITE_OTHER): Payer: 59 | Admitting: Psychology

## 2019-11-21 DIAGNOSIS — F429 Obsessive-compulsive disorder, unspecified: Secondary | ICD-10-CM | POA: Diagnosis not present

## 2019-11-21 DIAGNOSIS — F411 Generalized anxiety disorder: Secondary | ICD-10-CM | POA: Diagnosis not present

## 2019-12-05 ENCOUNTER — Ambulatory Visit (INDEPENDENT_AMBULATORY_CARE_PROVIDER_SITE_OTHER): Payer: 59 | Admitting: Psychology

## 2019-12-05 DIAGNOSIS — F429 Obsessive-compulsive disorder, unspecified: Secondary | ICD-10-CM | POA: Diagnosis not present

## 2019-12-05 DIAGNOSIS — F411 Generalized anxiety disorder: Secondary | ICD-10-CM

## 2019-12-07 ENCOUNTER — Encounter: Payer: Self-pay | Admitting: Psychiatry

## 2019-12-07 ENCOUNTER — Telehealth (INDEPENDENT_AMBULATORY_CARE_PROVIDER_SITE_OTHER): Payer: 59 | Admitting: Psychiatry

## 2019-12-07 DIAGNOSIS — F4001 Agoraphobia with panic disorder: Secondary | ICD-10-CM | POA: Diagnosis not present

## 2019-12-07 DIAGNOSIS — F422 Mixed obsessional thoughts and acts: Secondary | ICD-10-CM | POA: Diagnosis not present

## 2019-12-07 DIAGNOSIS — F331 Major depressive disorder, recurrent, moderate: Secondary | ICD-10-CM | POA: Diagnosis not present

## 2019-12-07 MED ORDER — SERTRALINE HCL 100 MG PO TABS: 200.0000 mg | ORAL_TABLET | Freq: Every day | ORAL | 1 refills | Status: DC

## 2019-12-07 NOTE — Progress Notes (Signed)
Gina Gillespie [id number] 1990/05/07 28 y.o.  Subjective:   Patient ID:  Gina Gillespie is a 29 y.o. (DOB 1990/05/07) female.  Chief Complaint:  Chief Complaint  Patient presents with  . Follow-up  . Anxiety  . Depression    Depression        Associated symptoms include no decreased concentration, no fatigue and no suicidal ideas.  Gina Gillespie presents to the office today for follow-up of OCDand recent hospitalization for SI DT marital crisis.   seen July 26, 2018.  She was pregnant and had reduced sertraline to 200 mg daily.  seen August 2020.  He was [redacted] weeks pregnant taking sertraline.  She was in a marital crisis.  No meds were changed.  Due date was March 04, 2019  03/16/2019 appointment with the following noted: There were no med changes. Delivery Feb 1, Gina Gillespie.   Doing well.  Delivery uneventful at home.  Husband caught her and was good. Doing OK.  Still a lot going on.  New counselor and need to still work through things.  [facility] [place].    Anxiety and OCD fair.  No Xanax since December panic over issues with husband.  Has had Obsessive thoughts over that. Also some postpartum depression.  Could be a lot worse.  Part of me doesn't have time to freak out bc so busy.  Anxiety and OCD worse postpartum at night mainly.  After 2 other kids she handles it better.  Trying to intentionally enjoy the baby.  Better handling the intrusive thoughts that she's a bad mom and children would be better off without her.  Preparing for RTW in 3 weeks and not sure about care of the kids.  Good function.  Had SI 3rd night after delivery but not since.  Breast feeding without problems. Gina Gillespie working new job nights.  Had been sleeping separately in the same house.  Up to him if they stay together or separate. He's in counseling also. OK but not great.  Fine with meds.  Seeing therapist in [place] and she and H see Dr. Dellia Gillespie for couples.   Hard being alone.  Not sure what  will happen.  Doesn't like being alone but handling it better than in the past.  Gina Gillespie's mother is coming to help her and she's ok with that. Still some depression to a moderate degree witht the situation.  denies  irritable moods.  Patient denies any recent difficulty with anxiety, usually.  OCD managed well-enough generally. Patient denies difficulty with sleep initiation or maintenance. Denies appetite disturbance.  Patient reports that energy and motivation have been good.  Patient denies any difficulty with concentration.  Patient denies any suicidal ideation.  Some hopeless feelings. Youngest child may interfere with sleep.  2 kids.  Took sertraline 200 while pregnant with Gina Gillespie but still had ppd.  12/07/19 appt with following noted: D Gina Gillespie 9 months and breastfeeding and plans to for another year or 2 maybe.  [place]  [place] to [place].  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   She and 2 girls had mild Covid in August.   She and H still in conflict and he's not sure he will stay in the marriage. Counseling Dr. Dellia Gillespie. Appreciative of not having dark thoughts like in the past.  Handles nights being alone better than in the past.  Rare Xanax except flights.   Got GI virus leading to panic and  needed Xanax. 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 OCD is overall manageable and better with being busy. Wonders about ADD after learning about it.  Gets overwhelmed starting things even if simple.  Seeing her personal therapist.  She was admitted voluntarily on July 05, 2018 and discharged on June 10 from [facility].  She was pregnant and it appears sertraline was reduced to 200mg  from 300 mg.  Kids: boy, girl, girl  Past Psychiatric Medication Trials:   Sertraline 300, Lexapro, Xanax   Review of Systems:  Review of Systems  Constitutional: Negative for fatigue.       Night sweats    Gastrointestinal: Negative for nausea.  Neurological: Negative for tremors and weakness.  Psychiatric/Behavioral: Positive for depression. Negative for agitation, behavioral problems, confusion, decreased concentration, dysphoric mood, hallucinations, self-injury, sleep disturbance and suicidal ideas. The patient is nervous/anxious. The patient is not hyperactive.     Medications: I have reviewed the patient's current medications.  Current Outpatient Medications  Medication Sig Dispense Refill  . Docosahexaenoic Acid (PRENATAL DHA PO) Take by mouth.    . fluticasone (FLONASE) 50 MCG/ACT nasal spray Place 2 sprays into both nostrils daily. 16 g 6  . sertraline (ZOLOFT) 100 MG tablet Take 2 tablets (200 mg total) by mouth daily. TAKE 2 TABLETS(200 MG) BY MOUTH DAILY 180 tablet 1   No current facility-administered medications for this visit.    Medication Side Effects: Other: reduced libido and weight gain and night sweats  Allergies: No Known Allergies  Past Medical History:  Diagnosis Date  . Anxiety   . Depression   . History of chicken pox   . OCD (obsessive compulsive disorder)     Family History  Problem Relation Age of Onset  . Alcohol abuse Father   . Cancer Father   . Depression Father   . Mental illness Father   . Throat cancer Father   . Mental illness Brother   . Arthritis Maternal Grandmother   . Depression Maternal Grandmother   . Diabetes Maternal Grandmother   . Hearing loss Maternal Grandmother   . Hypertension Maternal Grandmother   . Alcohol abuse Maternal Grandfather   . Cancer Maternal Grandfather   . Early death Paternal Grandmother   . Alcohol abuse Paternal Grandfather   . Cancer Paternal Grandfather     Social History   Socioeconomic History  . Marital status: Married    Spouse name: Not on file  . Number of children: Not on file  . Years of education: Not on file  . Highest education level: Not on file  Occupational History  . Not on  file  Tobacco Use  . Smoking status: Never Smoker  . Smokeless tobacco: Never Used  Vaping Use  . Vaping Use: Never used  Substance and Sexual Activity  . Alcohol use: Never  . Drug use: Never  . Sexual activity: Yes    Partners: Male    Birth control/protection: Condom    Comment: did not use condoms consistently  Other Topics Concern  . Not on file  Social History Narrative  . Not on file   Social Determinants of Health   Financial Resource Strain:   . Difficulty of Paying Living Expenses: Not on file  Food Insecurity:   . Worried About in the Last Year: Not on file  . Ran Out of Food in the Last Year: Not on file  Transportation Needs:   .  Lack of Transportation (Medical): Not on file  . Lack of Transportation (Non-Medical): Not on file  Physical Activity:   . Days of Exercise per Week: Not on file  . Minutes of Exercise per Session: Not on file  Stress:   . Feeling of Stress : Not on file  Social Connections:   . Frequency of Communication with Friends and Family: Not on file  . Frequency of Social Gatherings with Friends and Family: Not on file  . Attends Religious Services: Not on file  . Active Member of Clubs or Organizations: Not on file  . Attends Banker Meetings: Not on file  . Marital Status: Not on file  Intimate Partner Violence:   . Fear of Current or Ex-Partner: Not on file  . Emotionally Abused: Not on file  . Physically Abused: Not on file  . Sexually Abused: Not on file    Past Medical History, Surgical history, Social history, and Family history were reviewed and updated as appropriate.   Please see review of systems for further details on the patient's review from today.   Objective:   Physical Exam:  There were no vitals taken for this visit.  Physical Exam Neurological:     Mental Status: She is alert and oriented to person, place, and time.     Cranial Nerves: No dysarthria.  Psychiatric:         Attention and Perception: Attention and perception normal.        Mood and Affect: Mood is anxious and depressed.        Speech: Speech normal.        Behavior: Behavior is cooperative.        Thought Content: Thought content normal. Thought content is not paranoid or delusional. Thought content does not include homicidal or suicidal ideation. Thought content does not include homicidal or suicidal plan.        Cognition and Memory: Cognition and memory normal.        Judgment: Judgment normal.     Comments: Insight intact Mild baby blues a lot better than in the past     Lab Review:     Component Value Date/Time   NA 138 07/06/2018 0651   K 3.9 07/06/2018 0651   CL 106 07/06/2018 0651   CO2 25 07/06/2018 0651   GLUCOSE 99 07/06/2018 0651   BUN 10 07/06/2018 0651   CREATININE 0.61 07/06/2018 0651   CALCIUM 8.7 (L) 07/06/2018 0651   PROT 6.5 07/06/2018 0651   ALBUMIN 3.9 07/06/2018 0651   AST 16 07/06/2018 0651   ALT 17 07/06/2018 0651   ALKPHOS 52 07/06/2018 0651   BILITOT 0.5 07/06/2018 0651   GFRNONAA >60 07/06/2018 0651   GFRAA >60 07/06/2018 0651       Component Value Date/Time   WBC 6.7 07/06/2018 0651   RBC 4.14 07/06/2018 0651   HGB 12.4 07/06/2018 0651   HCT 38.5 07/06/2018 0651   PLT 235 07/06/2018 0651   MCV 93.0 07/06/2018 0651   MCH 30.0 07/06/2018 0651   MCHC 32.2 07/06/2018 0651   RDW 12.8 07/06/2018 0651    No results found for: POCLITH, LITHIUM   No results found for: PHENYTOIN, PHENOBARB, VALPROATE, CBMZ   .res Assessment: Plan:    Gina Gillespie was seen today for follow-up, anxiety and depression.  Diagnoses and all orders for this visit:  Major depressive disorder, recurrent episode, moderate (HCC)  Mixed obsessional thoughts and acts  Panic disorder with agoraphobia  Other orders -     sertraline (ZOLOFT) 100 MG tablet; Take 2 tablets (200 mg total) by mouth daily. TAKE 2 TABLETS(200 MG) BY MOUTH DAILY  Greater than 50% of face to face  time with patient was spent on counseling and coordination of care. We discussed the following. Patient with recent marital crisis leading to suicidal thoughts and a hospitalization.  Suicidal thoughts have resolved.  Her depression has somewhat worsened since the last visit because of the circumstances are with worse.  She is managing.  She does not want a change in medicines.  Discussed the pros and cons of serotonin medicines in nursing and the potential unknowns as well affecting the baby.  She clearly has severe OCD which will certainly worsen if she stops the medication.  She had a recent depression with suicidal thoughts.  The risk of stopping the medication exceeds the benefits.  The dosage has been reduced in an attempt to reduce any potential theoretical risk to the baby.  She agrees to continue the medication as is.  Supportive therapy dealing with marital crisis.   And possible therapists and how to deal with the crisis in the interim. Has prexisting sexual issues she needs to address also.  Disc the off-label use of N-Acetylcysteine at 600 mg daily to help with mild cognitive problems. Higher dose for OCD sometimes discussed. Stimulants not ideal in breastfeeding.  Disc risk Xanax for panic in breastfeeding.  She is using it very rarely wants every few weeks.  We discussed the short-term risks associated with benzodiazepines including sedation and increased fall risk among others.  Discussed long-term side effect risk including dependence, potential withdrawal symptoms, and the potential eventual dose-related risk of dementia.  No med change indicated bc she's breast feeding.  This appt was 30 mins.  FU 6 mo  Meredith Staggers, MD, DFAPA    Please see After Visit Summary for patient specific instructions.  Future Appointments  Date Time Provider Department Center  12/19/2019  5:00 PM Haze Rushing, PhD [facility] None  01/02/2020  5:00 PM Haze Rushing, PhD [facility] None   01/16/2020  5:00 PM Haze Rushing, PhD [facility] None  01/30/2020  5:00 PM Haze Rushing, PhD [facility] None  02/13/2020  5:00 PM Haze Rushing, PhD [facility] None    No orders of the defined types were placed in this encounter.     -------------------------------

## 2019-12-19 ENCOUNTER — Ambulatory Visit: Payer: Self-pay | Admitting: Psychology

## 2020-01-02 ENCOUNTER — Ambulatory Visit (INDEPENDENT_AMBULATORY_CARE_PROVIDER_SITE_OTHER): Payer: 59 | Admitting: Psychology

## 2020-01-02 DIAGNOSIS — F411 Generalized anxiety disorder: Secondary | ICD-10-CM

## 2020-01-02 DIAGNOSIS — F429 Obsessive-compulsive disorder, unspecified: Secondary | ICD-10-CM

## 2020-01-16 ENCOUNTER — Ambulatory Visit: Payer: Self-pay | Admitting: Psychology

## 2020-01-30 ENCOUNTER — Ambulatory Visit (INDEPENDENT_AMBULATORY_CARE_PROVIDER_SITE_OTHER): Payer: 59 | Admitting: Psychology

## 2020-01-30 DIAGNOSIS — F429 Obsessive-compulsive disorder, unspecified: Secondary | ICD-10-CM | POA: Diagnosis not present

## 2020-01-30 DIAGNOSIS — F411 Generalized anxiety disorder: Secondary | ICD-10-CM | POA: Diagnosis not present

## 2020-02-13 ENCOUNTER — Ambulatory Visit (INDEPENDENT_AMBULATORY_CARE_PROVIDER_SITE_OTHER): Payer: 59 | Admitting: Psychology

## 2020-02-13 DIAGNOSIS — F429 Obsessive-compulsive disorder, unspecified: Secondary | ICD-10-CM | POA: Diagnosis not present

## 2020-02-13 DIAGNOSIS — F411 Generalized anxiety disorder: Secondary | ICD-10-CM

## 2020-02-27 ENCOUNTER — Ambulatory Visit (INDEPENDENT_AMBULATORY_CARE_PROVIDER_SITE_OTHER): Payer: 59 | Admitting: Psychology

## 2020-02-27 DIAGNOSIS — F429 Obsessive-compulsive disorder, unspecified: Secondary | ICD-10-CM | POA: Diagnosis not present

## 2020-02-27 DIAGNOSIS — F411 Generalized anxiety disorder: Secondary | ICD-10-CM

## 2020-03-12 ENCOUNTER — Ambulatory Visit: Payer: 59 | Admitting: Psychology

## 2020-03-26 ENCOUNTER — Ambulatory Visit: Payer: 59 | Admitting: Psychology

## 2020-03-27 ENCOUNTER — Ambulatory Visit (INDEPENDENT_AMBULATORY_CARE_PROVIDER_SITE_OTHER): Payer: 59 | Admitting: Psychology

## 2020-03-27 DIAGNOSIS — F411 Generalized anxiety disorder: Secondary | ICD-10-CM | POA: Diagnosis not present

## 2020-03-27 DIAGNOSIS — F429 Obsessive-compulsive disorder, unspecified: Secondary | ICD-10-CM | POA: Diagnosis not present

## 2020-04-09 ENCOUNTER — Ambulatory Visit (INDEPENDENT_AMBULATORY_CARE_PROVIDER_SITE_OTHER): Payer: 59 | Admitting: Psychology

## 2020-04-09 DIAGNOSIS — F411 Generalized anxiety disorder: Secondary | ICD-10-CM | POA: Diagnosis not present

## 2020-04-09 DIAGNOSIS — F429 Obsessive-compulsive disorder, unspecified: Secondary | ICD-10-CM | POA: Diagnosis not present

## 2020-04-23 ENCOUNTER — Ambulatory Visit (INDEPENDENT_AMBULATORY_CARE_PROVIDER_SITE_OTHER): Payer: 59 | Admitting: Psychology

## 2020-04-23 DIAGNOSIS — F411 Generalized anxiety disorder: Secondary | ICD-10-CM

## 2020-04-23 DIAGNOSIS — F429 Obsessive-compulsive disorder, unspecified: Secondary | ICD-10-CM

## 2020-05-03 ENCOUNTER — Telehealth (INDEPENDENT_AMBULATORY_CARE_PROVIDER_SITE_OTHER): Payer: 59 | Admitting: Family Medicine

## 2020-05-03 ENCOUNTER — Ambulatory Visit
Admission: EM | Admit: 2020-05-03 | Discharge: 2020-05-03 | Disposition: A | Discharge status: Home / Self Care | Payer: 59 | Attending: Emergency Medicine | Admitting: Emergency Medicine

## 2020-05-03 ENCOUNTER — Other Ambulatory Visit: Payer: Self-pay

## 2020-05-03 DIAGNOSIS — J069 Acute upper respiratory infection, unspecified: Secondary | ICD-10-CM

## 2020-05-03 DIAGNOSIS — R0600 Dyspnea, unspecified: Secondary | ICD-10-CM | POA: Diagnosis not present

## 2020-05-03 DIAGNOSIS — J111 Influenza due to unidentified influenza virus with other respiratory manifestations: Secondary | ICD-10-CM | POA: Diagnosis not present

## 2020-05-03 LAB — POCT RAPID STREP A (OFFICE): Rapid Strep A Screen: NEGATIVE

## 2020-05-03 MED ORDER — ONDANSETRON 4 MG PO TBDP: 4.0000 mg | ORAL_TABLET | Freq: Three times a day (TID) | ORAL | 0 refills | Status: DC | PRN

## 2020-05-03 MED ORDER — DM-GUAIFENESIN ER 30-600 MG PO TB12: 1.0000 | ORAL_TABLET | Freq: Two times a day (BID) | ORAL | 0 refills | Status: DC

## 2020-05-03 MED ORDER — CETIRIZINE HCL 10 MG PO CAPS: 10.0000 mg | ORAL_CAPSULE | Freq: Every day | ORAL | 0 refills | Status: DC

## 2020-05-03 MED ORDER — IBUPROFEN 800 MG PO TABS: 800.0000 mg | ORAL_TABLET | Freq: Three times a day (TID) | ORAL | 0 refills | Status: DC

## 2020-05-03 NOTE — ED Provider Notes (Signed)
[facility]    CSN: [id number] Arrival date & time: 05/03/20  1207      History   Chief Complaint Chief Complaint  Patient presents with  . Cough  . Abdominal Pain  . Sore Throat    HPI Gina Gillespie is a 30 y.o. female presenting today for evaluation of fever URI symptoms and abdominal pain.  Reports cough, congestion, sore throat.  Has had associated fever and chills also endorses abdominal pain.  Symptoms began 2 to 3 days ago.  Has felt a lot of fatigue and generalized weakness.  Reports increased nausea and vomiting today and poor oral intake.  Denies diarrhea.  Reports close sick contacts.  Covid infection and 08/2019.  HPI  Past Medical History:  Diagnosis Date  . Anxiety   . Depression   . History of chicken pox   . OCD (obsessive compulsive disorder)     Patient Active Problem List   Diagnosis Date Noted  . Stressful life event affecting family 08/27/2018  . Supervision of low-risk pregnancy 08/11/2018  . MDD (major depressive disorder), single episode, severe , no psychosis (HCC) 07/06/2018  . Severe recurrent major depression without psychotic features (HCC) 07/05/2018  . GAD (generalized anxiety disorder) 03/22/2018  . Bilateral leg pain 08/09/2017    History reviewed. No pertinent surgical history.  OB History    Gravida  3   Para  2   Term  2   Preterm      AB      Living  2     SAB      IAB      Ectopic      Multiple      Live Births  2            Home Medications    Prior to Admission medications   Medication Sig Start Date End Date Taking? Authorizing Provider  Cetirizine HCl 10 MG CAPS Take 1 capsule (10 mg total) by mouth daily for 10 days. 05/03/20 05/13/20 Yes Aviah Sorci C, PA-C  dextromethorphan-guaiFENesin (MUCINEX DM) 30-600 MG 12hr tablet Take 1 tablet by mouth 2 (two) times daily. 05/03/20  Yes Wendel Homeyer C, PA-C  ibuprofen (ADVIL) 800 MG tablet Take 1 tablet (800 mg total) by mouth 3 (three)  times daily. 05/03/20  Yes Martyn Timme C, PA-C  ondansetron (ZOFRAN ODT) 4 MG disintegrating tablet Take 1 tablet (4 mg total) by mouth every 8 (eight) hours as needed for nausea or vomiting. 05/03/20  Yes Johnanthony Wilden C, PA-C  Docosahexaenoic Acid (PRENATAL DHA PO) Take by mouth.    [provider]  fluticasone (FLONASE) 50 MCG/ACT nasal spray Place 2 sprays into both nostrils daily. 08/11/19   Orland Mustard, MD  sertraline (ZOLOFT) 100 MG tablet Take 2 tablets (200 mg total) by mouth daily. TAKE 2 TABLETS(200 MG) BY MOUTH DAILY 12/07/19   Cottle, Steva Ready., MD    Family History Family History  Problem Relation Age of Onset  . Alcohol abuse Father   . Cancer Father   . Depression Father   . Mental illness Father   . Throat cancer Father   . Mental illness Brother   . Arthritis Maternal Grandmother   . Depression Maternal Grandmother   . Diabetes Maternal Grandmother   . Hearing loss Maternal Grandmother   . Hypertension Maternal Grandmother   . Alcohol abuse Maternal Grandfather   . Cancer Maternal Grandfather   . Early death Paternal Grandmother   .  Alcohol abuse Paternal Grandfather   . Cancer Paternal Grandfather     Social History Social History   Tobacco Use  . Smoking status: Never Smoker  . Smokeless tobacco: Never Used  Vaping Use  . Vaping Use: Never used  Substance Use Topics  . Alcohol use: Never  . Drug use: Never     Allergies   Patient has no known allergies.   Review of Systems Review of Systems  Constitutional: Positive for chills and fever. Negative for activity change, appetite change and fatigue.  HENT: Positive for congestion, rhinorrhea, sinus pressure and sore throat. Negative for ear pain and trouble swallowing.   Eyes: Negative for discharge and redness.  Respiratory: Positive for cough. Negative for chest tightness and shortness of breath.   Cardiovascular: Negative for chest pain.  Gastrointestinal: Positive for abdominal  pain. Negative for diarrhea, nausea and vomiting.  Musculoskeletal: Negative for myalgias.  Skin: Negative for rash.  Neurological: Negative for dizziness, light-headedness and headaches.     Physical Exam Triage Vital Signs ED Triage Vitals  Enc Vitals Group     BP      Pulse      Resp      Temp      Temp src      SpO2      Weight      Height      Head Circumference      Peak Flow      Pain Score      Pain Loc      Pain Edu?      Excl. in GC?    No data found.  Updated Vital Signs BP 105/66 (BP Location: Right Arm)   Pulse (!) 122   Temp (!) 102.9 F (39.4 C) (Oral)   Resp 16   LMP 05/03/2020   SpO2 98%   Breastfeeding Yes   Visual Acuity Right Eye Distance:   Left Eye Distance:   Bilateral Distance:    Right Eye Near:   Left Eye Near:    Bilateral Near:     Physical Exam Vitals and nursing note reviewed.  Constitutional:      Appearance: She is well-developed.     Comments: No acute distress  HENT:     Head: Normocephalic and atraumatic.     Ears:     Comments: Bilateral ears without tenderness to palpation of external auricle, tragus and mastoid, EAC's without erythema or swelling, TM's with good bony landmarks and cone of light. Non erythematous.     Nose: Nose normal.     Mouth/Throat:     Comments: Oral mucosa pink and moist, no tonsillar enlargement or exudate. Posterior pharynx patent and nonerythematous, no uvula deviation or swelling. Normal phonation. Eyes:     Conjunctiva/sclera: Conjunctivae normal.  Cardiovascular:     Rate and Rhythm: Regular rhythm. Tachycardia present.  Pulmonary:     Effort: Pulmonary effort is normal. No respiratory distress.     Comments: Breathing comfortably at rest, CTABL, no wheezing, rales or other adventitious sounds auscultated Abdominal:     General: There is no distension.     Comments: Soft, nondistended, generalized tenderness to periumbilical area, no focal tenderness, negative rebound, negative  Rovsing, negative McBurney's, negative Murphy's  Musculoskeletal:        General: Normal range of motion.     Cervical back: Neck supple.  Skin:    General: Skin is warm and dry.  Neurological:     Mental Status: She  is alert and oriented to person, place, and time.      UC Treatments / Results  Labs (all labs ordered are listed, but only abnormal results are displayed) Labs Reviewed  COVID-19, FLU A+B NAA  CULTURE, GROUP A STREP [facility])  POCT RAPID STREP A (OFFICE)    EKG   Radiology No results found.  Procedures Procedures (including critical care time)  Medications Ordered in UC Medications - No data to display  Initial Impression / Assessment and Plan / UC Course  I have reviewed the triage vital signs and the nursing notes.  Pertinent labs & imaging results that were available during my care of the patient were reviewed by me and considered in my medical decision making (see chart for details).     Strep test negative, COVID/flu pending.  Suspect likely viral URI and recommending continued symptomatic and supportive care.  Lungs clear to auscultation today, negative rebound, negative peritoneal signs, do not suspect abdominal emergency at this time.  Zofran for nausea, push fluids.  Patient is breast-feeding, provided cetirizine and Mucinex DM for congestion and cough.  Discussed strict return precautions. Patient verbalized understanding and is agreeable with plan.  Final Clinical Impressions(s) / UC Diagnoses   Final diagnoses:  Viral URI with cough     Discharge Instructions     Strep test negative, COVID/flu test pending Zofran for nausea Ibuprofen 800 mg every 8 hours; Tylenol up to 1000 mg every 4-6 hours for fever, body aches, throat pain May begin daily cetirizine help with congestion and postnasal drainage Mucinex DM twice daily for congestion and cough Honey and hot tea with ginger, lemon to help with hoarseness and sore throat Rest and  fluids Follow-up if not improving or worsening    ED Prescriptions    Medication Sig Dispense Auth. Provider   dextromethorphan-guaiFENesin (MUCINEX DM) 30-600 MG 12hr tablet Take 1 tablet by mouth 2 (two) times daily. 20 tablet Brycelyn Gambino C, PA-C   ibuprofen (ADVIL) 800 MG tablet Take 1 tablet (800 mg total) by mouth 3 (three) times daily. 21 tablet Shanesha Bednarz C, PA-C   Cetirizine HCl 10 MG CAPS Take 1 capsule (10 mg total) by mouth daily for 10 days. 10 capsule Kema Santaella C, PA-C   ondansetron (ZOFRAN ODT) 4 MG disintegrating tablet Take 1 tablet (4 mg total) by mouth every 8 (eight) hours as needed for nausea or vomiting. 20 tablet Lalanya Rufener, [place] C, PA-C     PDMP not reviewed this encounter.   Candra Wegner, [place] C, PA-C 05/03/20 1254

## 2020-05-03 NOTE — ED Triage Notes (Signed)
Pt is here with a cough, abdominal pain, sore throat that started Tuesday, pt has taken Motrin to relieve discomfort. Pt says she has not eating much in the last 3 days.

## 2020-05-03 NOTE — Discharge Instructions (Addendum)
Strep test negative, COVID/flu test pending Zofran for nausea Ibuprofen 800 mg every 8 hours; Tylenol up to 1000 mg every 4-6 hours for fever, body aches, throat pain May begin daily cetirizine help with congestion and postnasal drainage Mucinex DM twice daily for congestion and cough Honey and hot tea with ginger, lemon to help with hoarseness and sore throat Rest and fluids Follow-up if not improving or worsening

## 2020-05-03 NOTE — Patient Instructions (Signed)
Seek in person evaluation as we discussed immediately.  If you are having worsening symptoms or do not have a ride please call 911 for assistance.   I hope you are feeling better soon!  It was nice to meet you today. I help [facility] out with telemedicine visits on Tuesdays and Thursdays and am available for visits on those days. If you have any concerns or questions following this visit please schedule a follow up visit with your Primary Care doctor or seek care at a local urgent care clinic to avoid delays in care.

## 2020-05-03 NOTE — Progress Notes (Signed)
Virtual Visit via Video Note  I connected with Terena  on 05/03/20 at 11:00 AM EDT by a video enabled telemedicine application and verified that I am speaking with the correct person using two identifiers.  Location patient: home, [place] Location provider:work or home office Persons participating in the virtual visit: patient, provider  I discussed the limitations of evaluation and management by telemedicine and the availability of in person appointments. The patient expressed understanding and agreed to proceed.   HPI:  Acute telemedicine visit for : -Onset: 3 days ago -rapid covid test negative this morning -Symptoms include: sore throat, cough, nasal congestion, body aches, subjective fever, temp 102.1 (no analgesics today), laryngitis, gagged on mucus this morning - but otherwise no nausea or vomiting, CP, SOB -currently feels winded even just with talking -neighbors kids have been sick with cough and fevers, daughter sick as well -Denies: inability to drink -Has tried: motrin -breastfeeding -Pertinent past medical history:  Denies any hx of lung disease -Pertinent medication allergies: nkda -COVID-19 vaccine status:not vaccinated, has not had flu shot  ROS: See pertinent positives and negatives per HPI.  Past Medical History:  Diagnosis Date  . Anxiety   . Depression   . History of chicken pox   . OCD (obsessive compulsive disorder)     No past surgical history on file.   Current Outpatient Medications:  .  Docosahexaenoic Acid (PRENATAL DHA PO), Take by mouth., Disp: , Rfl:  .  fluticasone (FLONASE) 50 MCG/ACT nasal spray, Place 2 sprays into both nostrils daily., Disp: 16 g, Rfl: 6 .  sertraline (ZOLOFT) 100 MG tablet, Take 2 tablets (200 mg total) by mouth daily. TAKE 2 TABLETS(200 MG) BY MOUTH DAILY, Disp: 180 tablet, Rfl: 1  EXAM:  VITALS per patient if applicable:  GENERAL: alert, oriented, appears to feel unwell, lying in bed  HEENT: atraumatic, conjunttiva  clear, no obvious abnormalities on inspection of external nose and ears  NECK: normal movements of the head and neck  LUNGS: seems to have some mild SOB with talking  CV: no obvious cyanosis  MS: moves all visible extremities without noticeable abnormality  PSYCH/NEURO: pleasant and cooperative, no obvious depression or anxiety, speech and thought processing grossly intact  ASSESSMENT AND PLAN:  Discussed the following assessment and plan:  Dyspnea, unspecified type  Influenza-like illness  -we discussed possible serious and likely etiologies, options for evaluation and workup, limitations of telemedicine visit vs in person visit, treatment, treatment risks and precautions. Given the reported chest discomfort and shortness of breath, advised she needs prompt in person evaluation at a higher level of care.  Discussed options for evaluation and she prefers self transport to a local urgent care.  She says her husband can take her.  She declines transport.  She agrees to go promptly today.    I discussed the assessment and treatment plan with the patient. The patient was provided an opportunity to ask questions and all were answered. The patient agreed with the plan and demonstrated an understanding of the instructions.     Terressa Koyanagi, DO

## 2020-05-04 ENCOUNTER — Other Ambulatory Visit: Payer: Self-pay | Admitting: Psychiatry

## 2020-05-04 ENCOUNTER — Telehealth: Payer: Self-pay | Admitting: Psychiatry

## 2020-05-04 MED ORDER — ALPRAZOLAM 0.5 MG PO TABS: 0.5000 mg | ORAL_TABLET | Freq: Three times a day (TID) | ORAL | 0 refills | Status: DC | PRN

## 2020-05-04 NOTE — Telephone Encounter (Signed)
Not listed on medication profile

## 2020-05-04 NOTE — Telephone Encounter (Signed)
Pt called requesting new Rx for Xanax. Rx expired. Only use when traveling and will be traveling 4/13. [facility] [address]. Follow up due May.

## 2020-05-04 NOTE — Telephone Encounter (Signed)
sent 

## 2020-05-05 LAB — COVID-19, FLU A+B NAA
Influenza A, NAA: DETECTED — AB
Influenza B, NAA: NOT DETECTED
SARS-CoV-2, NAA: NOT DETECTED

## 2020-05-06 LAB — CULTURE, GROUP A STREP (THRC)

## 2020-05-07 ENCOUNTER — Ambulatory Visit (INDEPENDENT_AMBULATORY_CARE_PROVIDER_SITE_OTHER): Payer: 59 | Admitting: Psychology

## 2020-05-07 DIAGNOSIS — F411 Generalized anxiety disorder: Secondary | ICD-10-CM

## 2020-05-07 DIAGNOSIS — F332 Major depressive disorder, recurrent severe without psychotic features: Secondary | ICD-10-CM | POA: Diagnosis not present

## 2020-05-21 ENCOUNTER — Ambulatory Visit (INDEPENDENT_AMBULATORY_CARE_PROVIDER_SITE_OTHER): Payer: 59 | Admitting: Psychology

## 2020-05-21 DIAGNOSIS — F429 Obsessive-compulsive disorder, unspecified: Secondary | ICD-10-CM | POA: Diagnosis not present

## 2020-05-21 DIAGNOSIS — F411 Generalized anxiety disorder: Secondary | ICD-10-CM | POA: Diagnosis not present

## 2020-06-04 ENCOUNTER — Ambulatory Visit (INDEPENDENT_AMBULATORY_CARE_PROVIDER_SITE_OTHER): Payer: 59 | Admitting: Psychology

## 2020-06-04 DIAGNOSIS — F411 Generalized anxiety disorder: Secondary | ICD-10-CM

## 2020-06-04 DIAGNOSIS — F429 Obsessive-compulsive disorder, unspecified: Secondary | ICD-10-CM | POA: Diagnosis not present

## 2020-06-18 ENCOUNTER — Ambulatory Visit (INDEPENDENT_AMBULATORY_CARE_PROVIDER_SITE_OTHER): Payer: 59 | Admitting: Psychology

## 2020-06-18 DIAGNOSIS — F429 Obsessive-compulsive disorder, unspecified: Secondary | ICD-10-CM

## 2020-06-18 DIAGNOSIS — F411 Generalized anxiety disorder: Secondary | ICD-10-CM | POA: Diagnosis not present

## 2020-07-02 ENCOUNTER — Ambulatory Visit (INDEPENDENT_AMBULATORY_CARE_PROVIDER_SITE_OTHER): Payer: 59 | Admitting: Psychology

## 2020-07-02 DIAGNOSIS — F411 Generalized anxiety disorder: Secondary | ICD-10-CM | POA: Diagnosis not present

## 2020-07-02 DIAGNOSIS — F429 Obsessive-compulsive disorder, unspecified: Secondary | ICD-10-CM | POA: Diagnosis not present

## 2020-07-05 NOTE — Progress Notes (Signed)
Canceled appt (patient)

## 2020-07-16 ENCOUNTER — Ambulatory Visit (INDEPENDENT_AMBULATORY_CARE_PROVIDER_SITE_OTHER): Payer: 59 | Admitting: Psychology

## 2020-07-16 DIAGNOSIS — F429 Obsessive-compulsive disorder, unspecified: Secondary | ICD-10-CM | POA: Diagnosis not present

## 2020-07-16 DIAGNOSIS — F411 Generalized anxiety disorder: Secondary | ICD-10-CM | POA: Diagnosis not present

## 2020-08-13 ENCOUNTER — Ambulatory Visit (INDEPENDENT_AMBULATORY_CARE_PROVIDER_SITE_OTHER): Payer: 59 | Admitting: Psychology

## 2020-08-13 DIAGNOSIS — F429 Obsessive-compulsive disorder, unspecified: Secondary | ICD-10-CM

## 2020-08-13 DIAGNOSIS — F411 Generalized anxiety disorder: Secondary | ICD-10-CM

## 2020-08-17 ENCOUNTER — Ambulatory Visit (INDEPENDENT_AMBULATORY_CARE_PROVIDER_SITE_OTHER): Payer: 59 | Admitting: Psychology

## 2020-08-17 DIAGNOSIS — F411 Generalized anxiety disorder: Secondary | ICD-10-CM

## 2020-08-17 DIAGNOSIS — F429 Obsessive-compulsive disorder, unspecified: Secondary | ICD-10-CM

## 2020-08-27 ENCOUNTER — Ambulatory Visit (INDEPENDENT_AMBULATORY_CARE_PROVIDER_SITE_OTHER): Payer: 59 | Admitting: Psychology

## 2020-08-27 DIAGNOSIS — F429 Obsessive-compulsive disorder, unspecified: Secondary | ICD-10-CM | POA: Diagnosis not present

## 2020-08-27 DIAGNOSIS — F411 Generalized anxiety disorder: Secondary | ICD-10-CM

## 2020-09-02 IMAGING — US OBSTETRIC <14 WK ULTRASOUND
1 series · 15 of 23 positions shown · non-contrast
Comparison: None

CLINICAL DATA: Pregnancy of uncertain dates

EXAM:
OBSTETRIC <14 WK ULTRASOUND
TECHNIQUE: Transabdominal ultrasound was performed for evaluation of the
gestation as well as the maternal uterus and adnexal regions.

[Series 1: obstetric <14 wk ultrasound · 23 acquisitions, 15 frames shown]
[im 1/23]
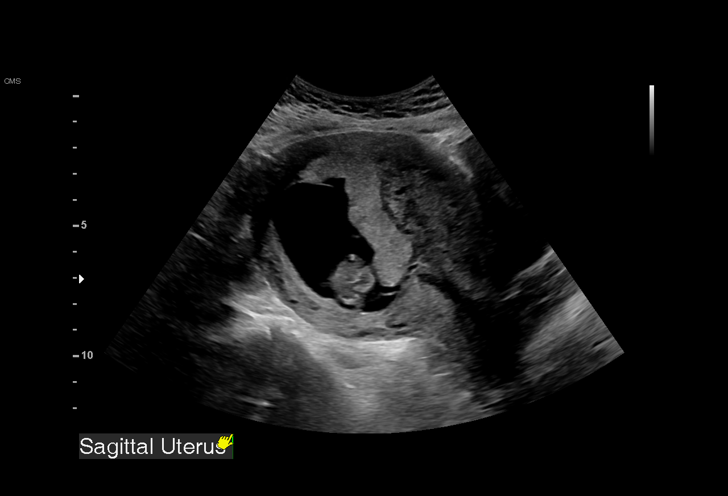
[im 3/23]
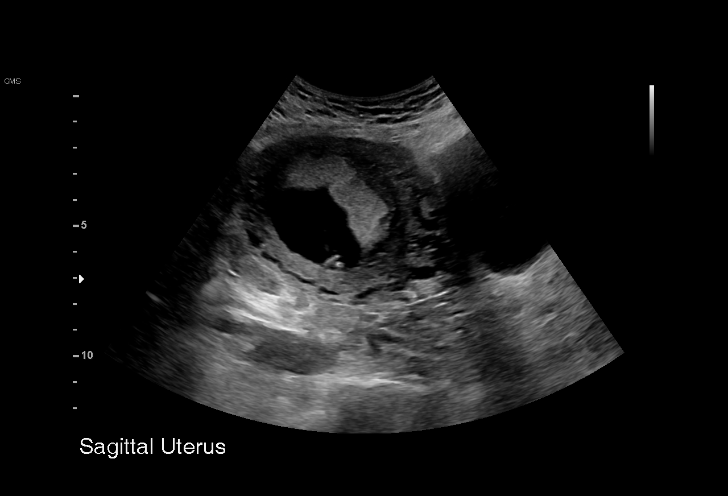
[im 4/23]
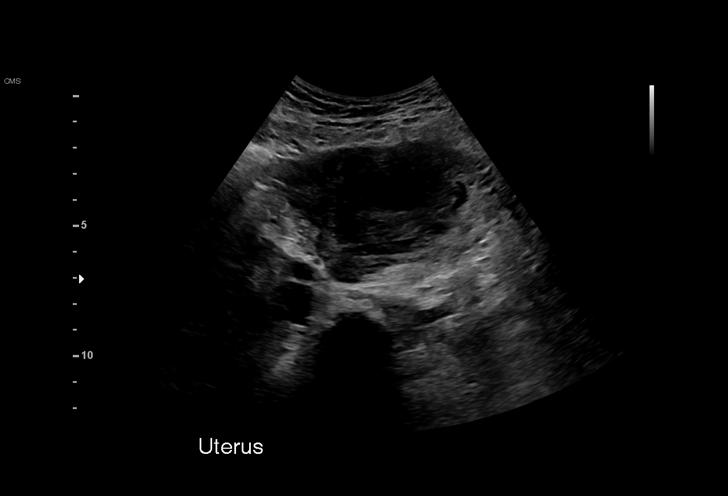
[im 6/23]
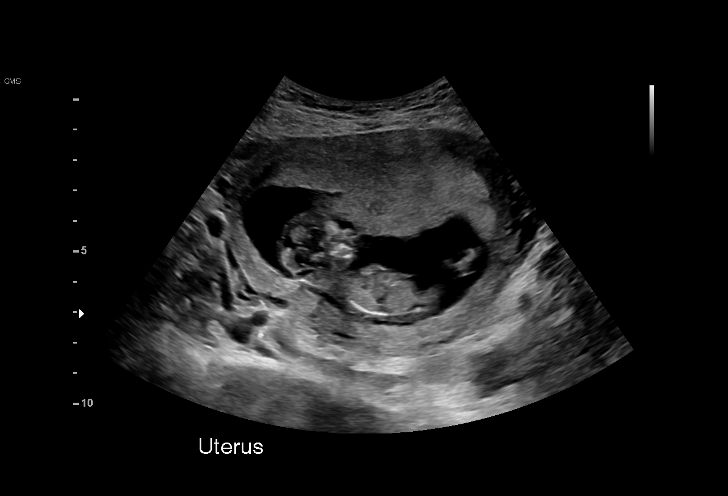
[im 7/23]
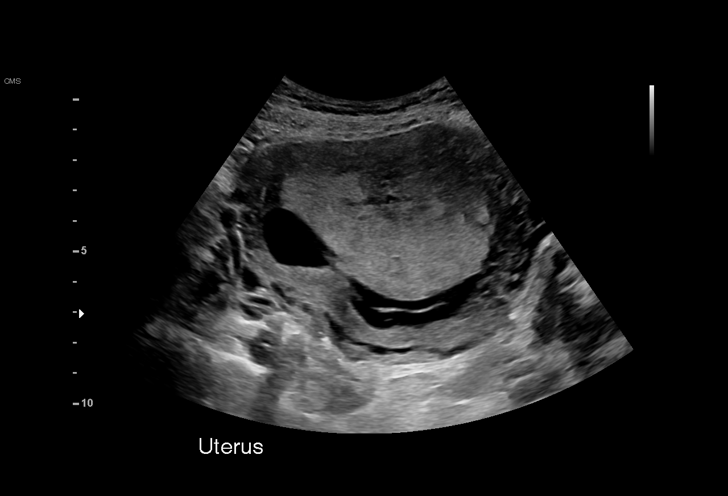
[im 9/23]
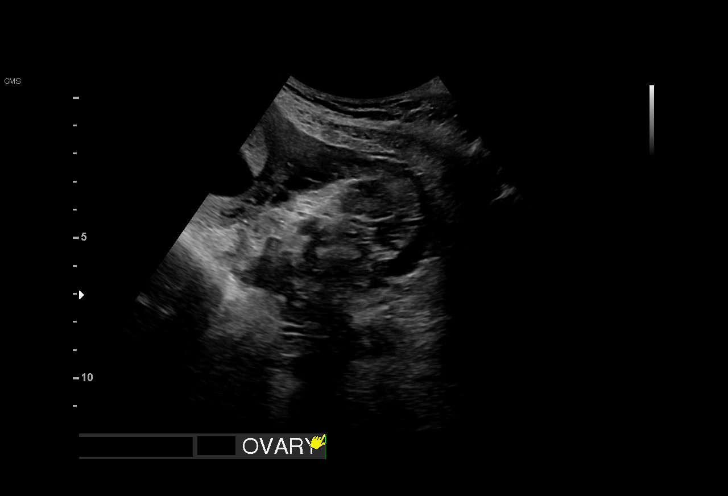
[im 10/23]
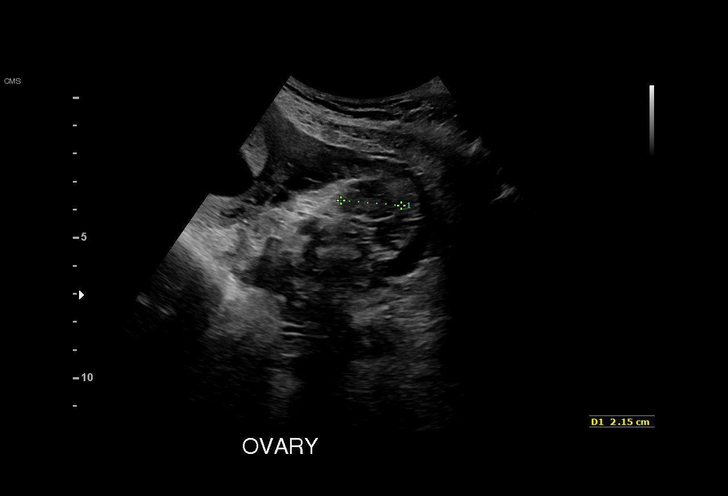
[im 12/23]
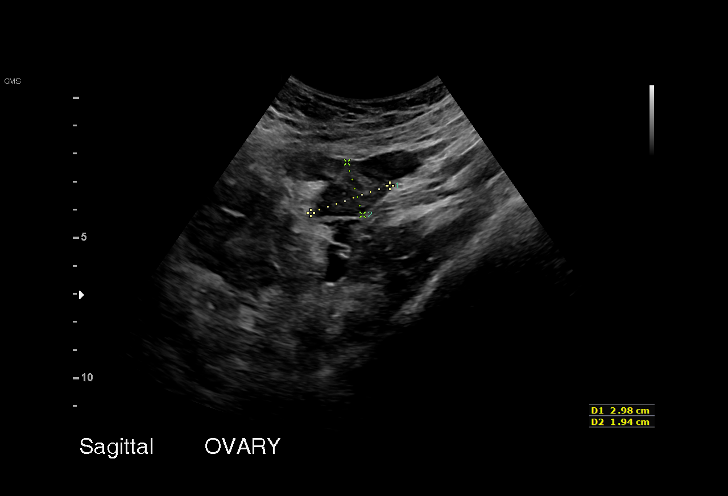
[im 14/23]
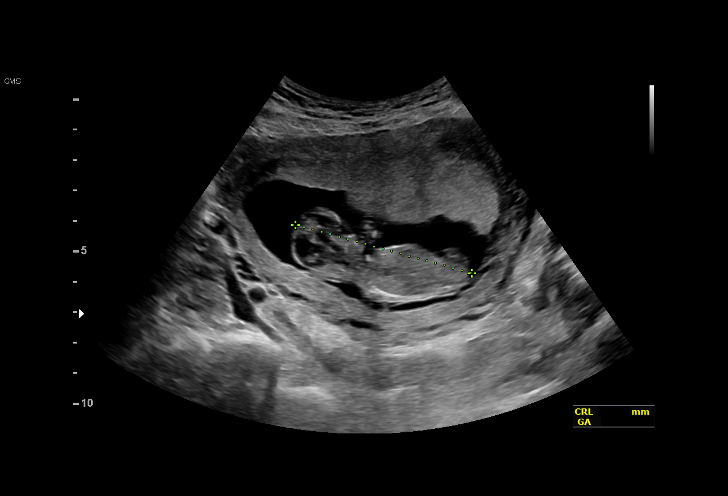
[im 15/23]
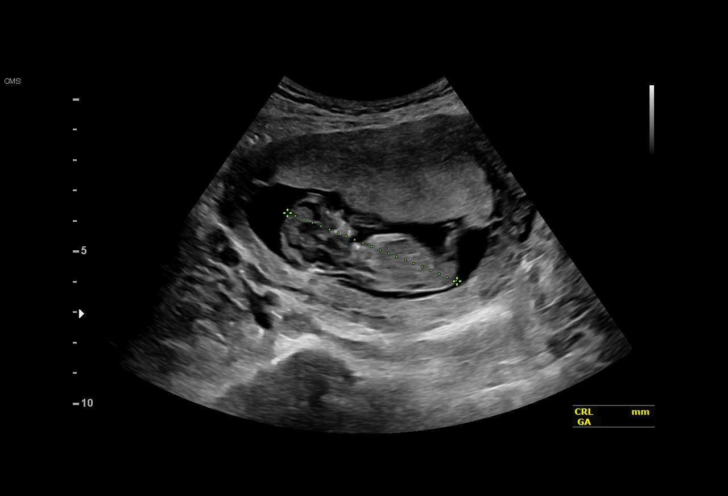
[im 17/23]
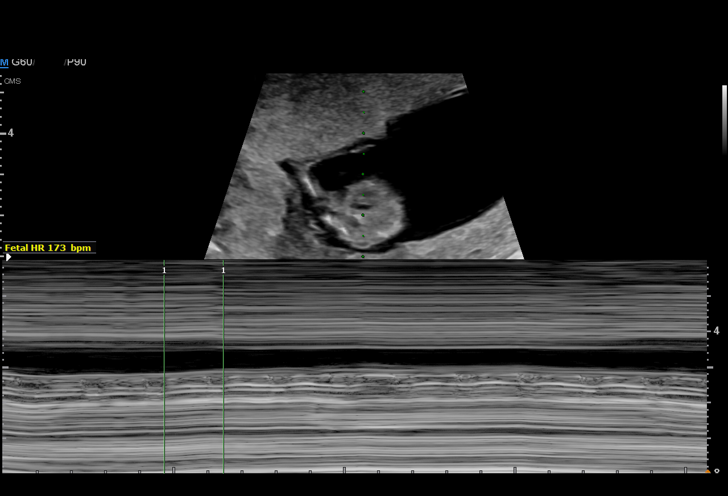
[im 18/23]
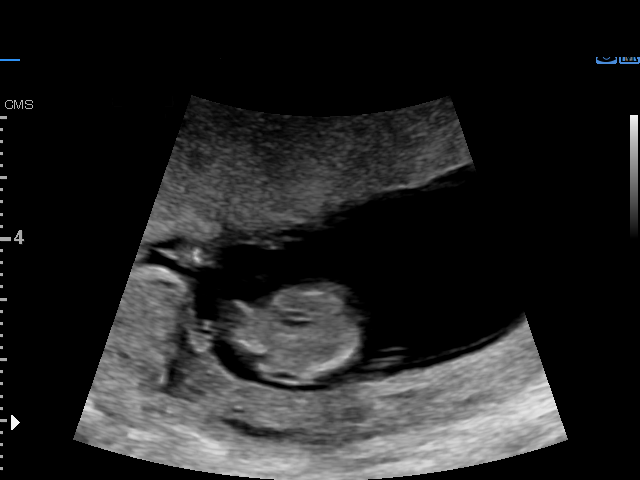
[im 20/23]
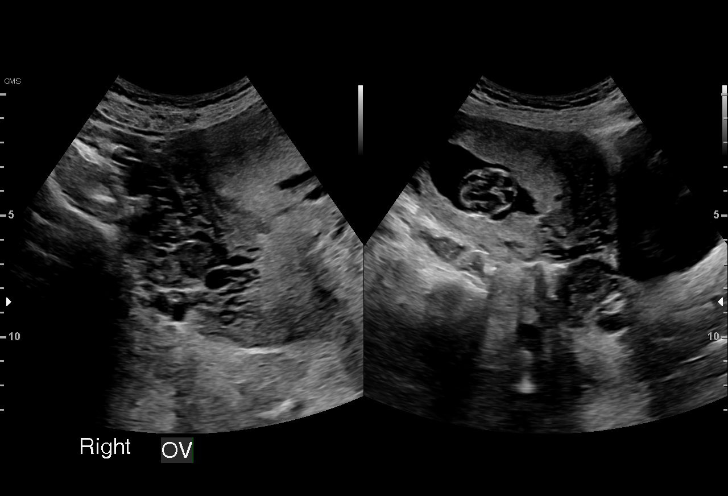
[im 21/23]
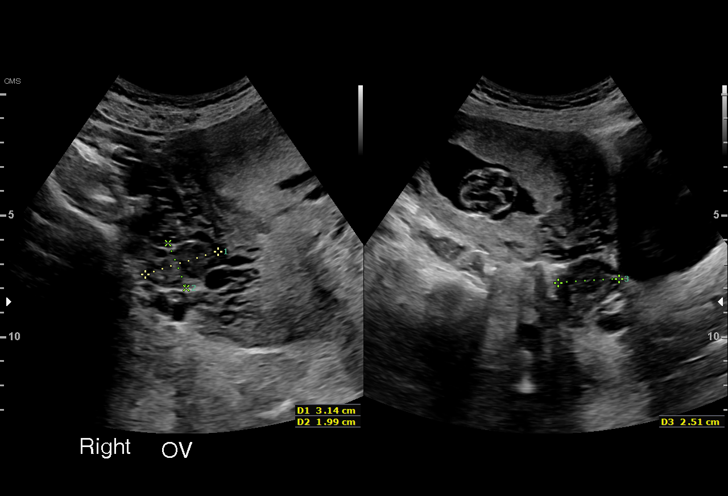
[im 23/23]
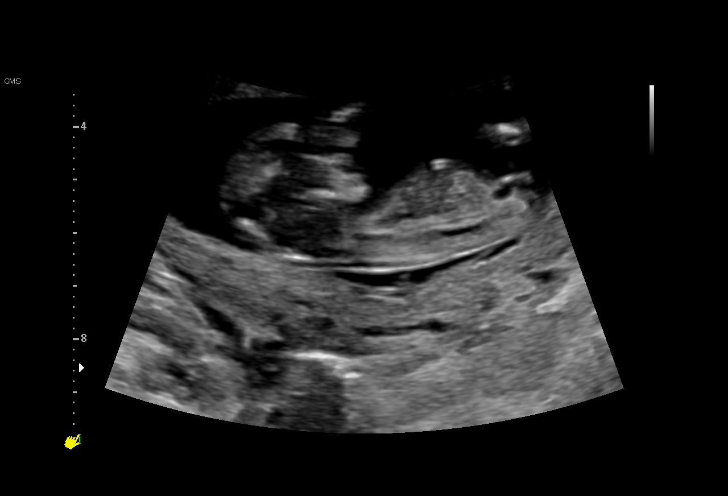

[15 of 23 positions shown; findings below may reference images not displayed]

FINDINGS: Intrauterine gestational sac: Present, single

Yolk sac:  Not visualized

Embryo:  Present

Cardiac Activity: Present

Heart Rate: 173 bpm

CRL:   60.2 mm   12 w 4 d                  US EDC: 03/04/2019

Subchorionic hemorrhage:  None visualized.

Maternal uterus/adnexae:

RIGHT ovary normal size and morphology 3.1 x 2.0 x 2.5 cm.

LEFT ovary normal size and morphology, 3.0 x 1.9 x 2.2 cm.

No free pelvic fluid or adnexal masses.
IMPRESSION: Single live intrauterine gestation at 12 weeks 4 days EGA by
crown-rump length.

No acute abnormalities.

## 2020-09-10 ENCOUNTER — Ambulatory Visit: Payer: 59 | Admitting: Psychology

## 2020-09-24 ENCOUNTER — Ambulatory Visit: Payer: 59 | Admitting: Psychology

## 2020-10-04 ENCOUNTER — Encounter: Payer: Self-pay | Admitting: Psychiatry

## 2020-10-04 ENCOUNTER — Other Ambulatory Visit: Payer: Self-pay

## 2020-10-04 ENCOUNTER — Ambulatory Visit (INDEPENDENT_AMBULATORY_CARE_PROVIDER_SITE_OTHER): Payer: 59 | Admitting: Psychiatry

## 2020-10-04 DIAGNOSIS — F4001 Agoraphobia with panic disorder: Secondary | ICD-10-CM | POA: Diagnosis not present

## 2020-10-04 DIAGNOSIS — F422 Mixed obsessional thoughts and acts: Secondary | ICD-10-CM | POA: Diagnosis not present

## 2020-10-04 DIAGNOSIS — F331 Major depressive disorder, recurrent, moderate: Secondary | ICD-10-CM

## 2020-10-04 MED ORDER — SERTRALINE HCL 100 MG PO TABS: 200.0000 mg | ORAL_TABLET | Freq: Every day | ORAL | 1 refills | Status: DC

## 2020-10-04 MED ORDER — ALPRAZOLAM 0.5 MG PO TABS: 0.5000 mg | ORAL_TABLET | Freq: Three times a day (TID) | ORAL | 0 refills | Status: DC | PRN

## 2020-10-04 NOTE — Progress Notes (Signed)
Gina Gillespie [id number] 01-29-1990 30 y.o.  Subjective:   Patient ID:  Gina Gillespie is a 30 y.o. (DOB 1990-07-18) female.  Chief Complaint:  Chief Complaint  Patient presents with   Follow-up   Depression   Medication Problem   Anxiety    Depression        Associated symptoms include no decreased concentration, no fatigue and no suicidal ideas. Gina Gillespie presents to the office today for follow-up of OCDand recent hospitalization for SI DT marital crisis.   seen July 26, 2018.  She was pregnant and had reduced sertraline to 200 mg daily.  seen August 2020.  He was [redacted] weeks pregnant taking sertraline.  She was in a marital crisis.  No meds were changed.  Due date was March 04, 2019  03/16/2019 appointment with the following noted: There were no med changes. Delivery Feb 1, Gina Gillespie.   Doing well.  Delivery uneventful at home.  Husband caught her and was good. Doing OK.  Still a lot going on.  New counselor and need to still work through things.  Through [facility] in [place].    Anxiety and OCD fair.  No Xanax since December panic over issues with husband.  Has had Obsessive thoughts over that. Also some postpartum depression.  Could be a lot worse.  Part of me doesn't have time to freak out bc so busy.  Anxiety and OCD worse postpartum at night mainly.  After 2 other kids she handles it better.  Trying to intentionally enjoy the baby.  Better handling the intrusive thoughts that she's a bad mom and children would be better off without her.  Preparing for RTW in 3 weeks and not sure about care of the kids.  Good function.  Had SI 3rd night after delivery but not since.  Breast feeding without problems. Gina Gillespie working new job nights.  Had been sleeping separately in the same house.  Up to him if they stay together or separate. He's in counseling also. OK but not great.  Fine with meds.  Seeing therapist in [place] and she and H see Dr. Dellia Gillespie for couples.   Hard being  alone.  Not sure what will happen.  Doesn't like being alone but handling it better than in the past.  Gina Gillespie's mother is coming to help her and she's ok with that. Still some depression to a moderate degree witht the situation.  denies  irritable moods.  Patient denies any recent difficulty with anxiety, usually.  OCD managed well-enough generally. Patient denies difficulty with sleep initiation or maintenance. Denies appetite disturbance.  Patient reports that energy and motivation have been good.  Patient denies any difficulty with concentration.  Patient denies any suicidal ideation.  Some hopeless feelings. Youngest child may interfere with sleep.  2 kids.  Took sertraline 200 while pregnant with Gina Gillespie but still had ppd.  12/07/19 appt with following noted: D Gina Gillespie 9 months and breastfeeding and plans to for another year or 2 maybe.  Got to go to [employer].  Awesome. Close to [place].  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   She and 2 girls had mild Covid in August.   She and H still in conflict and he's not sure he will stay in the marriage. Counseling Dr. Dellia Gillespie. Appreciative of not having dark thoughts like in the past.  Handles nights being alone better than in the past.  Rare Xanax except flights.   Got GI virus leading  to panic and needed Xanax. 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 OCD is overall manageable and better with being busy. Wonders about ADD after learning about it.  Gets overwhelmed starting things even if simple.  Seeing her personal therapist. Plan: No med change indicated bc she's breast feeding.  10/04/2020 appointment with the following noted: Still breast feeding 3-4 times per day. Called in July to talk about stopping meds bc tired of brain fog.  Stopped sertraline about mid July.  Started again mid August.  Was having inappropriate laughter with weird responses and also planning to go back to work soon and  felt it better to be on meds with more stress. Night sweats beginning of year resolved. Patient reports stable mood and denies depressed or irritable moods.  Patient denies any recent difficulty with anxiety.  Patient denies difficulty with sleep initiation or maintenance. Denies appetite disturbance.  Patient reports that energy and motivation have been good.  Patient denies any difficulty with concentration.  Patient denies any suicidal ideation. Still stress with husband affecting marriage but generally feels OK.  Sometimes he wants to separate. Xanax used sparingly about once monthly for panic. Last year normal things seem harder than usual.  Also wonders if she has ADD.  She was admitted voluntarily on July 05, 2018 and [facility]  She was pregnant and it appears sertraline was reduced to 200mg  from 300 mg.  Kids: boy, girl, girl  Past Psychiatric Medication Trials:   Sertraline 300, Lexapro, Xanax   Review of Systems:  Review of Systems  Constitutional:  Negative for fatigue.       Night sweats   Cardiovascular:  Negative for palpitations.  Gastrointestinal:  Negative for nausea.  Neurological:  Negative for tremors and weakness.  Psychiatric/Behavioral:  Negative for agitation, behavioral problems, confusion, decreased concentration, dysphoric mood, hallucinations, self-injury, sleep disturbance and suicidal ideas. The patient is nervous/anxious. The patient is not hyperactive.    Medications: I have reviewed the patient's current medications.  Current Outpatient Medications  Medication Sig Dispense Refill   ibuprofen (ADVIL) 800 MG tablet Take 1 tablet (800 mg total) by mouth 3 (three) times daily. 21 tablet 0   ALPRAZolam (XANAX) 0.5 MG tablet Take 1 tablet (0.5 mg total) by mouth 3 (three) times daily as needed for anxiety. 15 tablet 0   Cetirizine HCl 10 MG CAPS Take 1 capsule (10 mg total) by mouth daily for 10 days. 10 capsule 0    dextromethorphan-guaiFENesin (MUCINEX DM) 30-600 MG 12hr tablet Take 1 tablet by mouth 2 (two) times daily. (Patient not taking: Reported on 10/04/2020) 20 tablet 0   Docosahexaenoic Acid (PRENATAL DHA PO) Take by mouth. (Patient not taking: Reported on 10/04/2020)     fluticasone (FLONASE) 50 MCG/ACT nasal spray Place 2 sprays into both nostrils daily. (Patient not taking: Reported on 10/04/2020) 16 g 6   ondansetron (ZOFRAN ODT) 4 MG disintegrating tablet Take 1 tablet (4 mg total) by mouth every 8 (eight) hours as needed for nausea or vomiting. (Patient not taking: Reported on 10/04/2020) 20 tablet 0   sertraline (ZOLOFT) 100 MG tablet Take 2 tablets (200 mg total) by mouth daily. TAKE 2 TABLETS(200 MG) BY MOUTH DAILY 180 tablet 1   No current facility-administered medications for this visit.    Medication Side Effects: Other: reduced libido and weight gain and night sweats  Allergies: No Known Allergies  Past Medical History:  Diagnosis Date   Anxiety    Depression    History of chicken pox    OCD (obsessive compulsive disorder)     Family History  Problem Relation Age of Onset   Alcohol abuse Father    Cancer Father    Depression Father    Mental illness Father    Throat cancer Father    Mental illness Brother    Arthritis Maternal Grandmother    Depression Maternal Grandmother    Diabetes Maternal Grandmother    Hearing loss Maternal Grandmother    Hypertension Maternal Grandmother    Alcohol abuse Maternal Grandfather    Cancer Maternal Grandfather    Early death Paternal Grandmother    Alcohol abuse Paternal Grandfather    Cancer Paternal Grandfather     Social History   Socioeconomic History   Marital status: Married    Spouse name: Not on file   Number of children: Not on file   Years of education: Not on file   Highest education level: Not on file  Occupational History   Not on file  Tobacco Use   Smoking status: Never   Smokeless tobacco: Never  Vaping Use    Vaping Use: Never used  Substance and Sexual Activity   Alcohol use: Never   Drug use: Never   Sexual activity: Yes    Partners: Male    Birth control/protection: None  Other Topics Concern   Not on file  Social History Narrative   Not on file   Social Determinants of Health   Financial Resource Strain: Not on file  Food Insecurity: Not on file  Transportation Needs: Not on file  Physical Activity: Not on file  Stress: Not on file  Social Connections: Not on file  Intimate Partner Violence: Not on file    Past Medical History, Surgical history, Social history, and Family history were reviewed and updated as appropriate.   Please see review of systems for further details on the patient's review from today.   Objective:   Physical Exam:  There were no vitals taken for this visit.  Physical Exam Constitutional:      General: She is not in acute distress. Musculoskeletal:        General: No deformity.  Neurological:     Mental Status: She is alert and oriented to person, place, and time.     Cranial Nerves: No dysarthria.     Coordination: Coordination normal.  Psychiatric:        Attention and Perception: Attention and perception normal. She does not perceive auditory or visual hallucinations.        Mood and Affect: Mood is anxious. Mood is not depressed. Affect is not labile, blunt, angry or inappropriate.        Speech: Speech normal.        Behavior: Behavior normal. Behavior is cooperative.        Thought Content: Thought content normal. Thought content is not paranoid or delusional. Thought content does not include homicidal or suicidal ideation. Thought content does not include homicidal or suicidal plan.        Cognition and Memory: Cognition and memory normal.        Judgment: Judgment normal.     Comments: Insight intact Anxiety is manageable.    Lab Review:     Component Value Date/Time   NA 138 07/06/2018 0651   K 3.9 07/06/2018 0651   CL 106  07/06/2018 0651   CO2 25 07/06/2018 2248  GLUCOSE 99 07/06/2018 0651   BUN 10 07/06/2018 0651   CREATININE 0.61 07/06/2018 0651   CALCIUM 8.7 (L) 07/06/2018 0651   PROT 6.5 07/06/2018 0651   ALBUMIN 3.9 07/06/2018 0651   AST 16 07/06/2018 0651   ALT 17 07/06/2018 0651   ALKPHOS 52 07/06/2018 0651   BILITOT 0.5 07/06/2018 0651   GFRNONAA >60 07/06/2018 0651   GFRAA >60 07/06/2018 0651       Component Value Date/Time   WBC 6.7 07/06/2018 0651   RBC 4.14 07/06/2018 0651   HGB 12.4 07/06/2018 0651   HCT 38.5 07/06/2018 0651   PLT 235 07/06/2018 0651   MCV 93.0 07/06/2018 0651   MCH 30.0 07/06/2018 0651   MCHC 32.2 07/06/2018 0651   RDW 12.8 07/06/2018 0651    No results found for: POCLITH, LITHIUM   No results found for: PHENYTOIN, PHENOBARB, VALPROATE, CBMZ   .res Assessment: Plan:    Meghanne was seen today for follow-up, depression, medication problem and anxiety.  Diagnoses and all orders for this visit:  Major depressive disorder, recurrent episode, moderate (HCC) -     sertraline (ZOLOFT) 100 MG tablet; Take 2 tablets (200 mg total) by mouth daily. TAKE 2 TABLETS(200 MG) BY MOUTH DAILY  Mixed obsessional thoughts and acts -     sertraline (ZOLOFT) 100 MG tablet; Take 2 tablets (200 mg total) by mouth daily. TAKE 2 TABLETS(200 MG) BY MOUTH DAILY  Panic disorder with agoraphobia -     sertraline (ZOLOFT) 100 MG tablet; Take 2 tablets (200 mg total) by mouth daily. TAKE 2 TABLETS(200 MG) BY MOUTH DAILY  Other orders -     ALPRAZolam (XANAX) 0.5 MG tablet; Take 1 tablet (0.5 mg total) by mouth 3 (three) times daily as needed for anxiety. Greater than 50% of face to face time with patient was spent on counseling and coordination of care. We discussed the following. Sx are under better control than last visit and concerns about brain fog..  She is managing.  She does not want a change in medicines.  Discussed the pros and cons of serotonin medicines in nursing and the  potential unknowns as well affecting the baby.  She clearly has history of severe OCD which will certainly worsen if she stops the medication.  She had a recent depression with suicidal thoughts.  The risk of stopping the medication exceeds the benefits.  The dosage will be reduced to try to minimze brain fog and  in an attempt to reduce any potential theoretical risk to the baby.  Reduce sertraline to 150 for a month and if ok and still brain fog can try reducing to 100 mg daily.     Disc SE in detail and SSRI withdrawal sx.  Supportive therapy dealing with marital crisis.   And possible therapists and how to deal with the crisis in the interim. Has prexisting sexual issues she needs to address also.  Disc the off-label use of N-Acetylcysteine at 600 mg daily to help with mild cognitive problems. Higher dose for OCD sometimes discussed. Stimulants not ideal in breastfeeding.  Disc risk Xanax for panic in breastfeeding.  She is using it very rarely wants every few weeks.  We discussed the short-term risks associated with benzodiazepines including sedation and increased fall risk among others.  Discussed long-term side effect risk including dependence, potential withdrawal symptoms, and the potential eventual dose-related risk of dementia.  No stimulant while breast feeding.  This appt was 30 mins.  FU 4  mo  Meredith Staggers, MD, DFAPA    Please see After Visit Summary for patient specific instructions.  Future Appointments  Date Time Provider Department Center  10/08/2020  5:00 PM Haze Rushing, PhD [facility] None  10/22/2020  5:00 PM Haze Rushing, PhD [facility] None    No orders of the defined types were placed in this encounter.     -------------------------------

## 2020-10-08 ENCOUNTER — Ambulatory Visit: Payer: 59 | Admitting: Psychology

## 2020-10-20 IMAGING — US US MFM OB COMP +14 WKS
1 series · 13 of 28 positions shown · non-contrast
Comparison: none

[Series 1: us mfm ob comp +14 wks · 72 acquisitions, 13 frames shown]
[im 3/72]
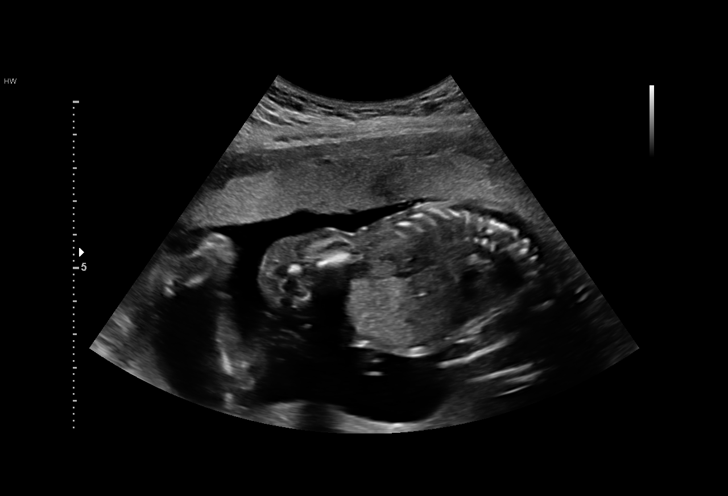
[im 8/72]
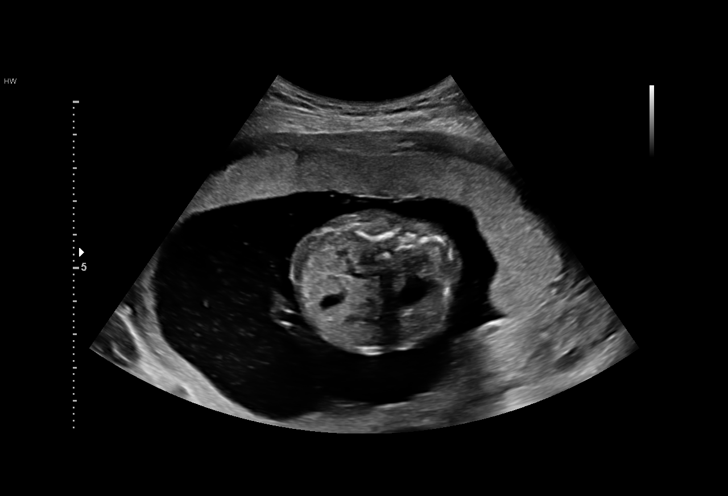
[im 14/72]
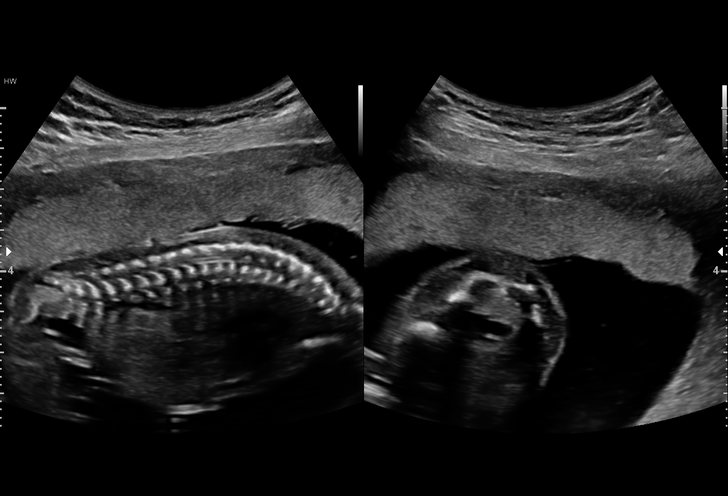
[im 19/72]
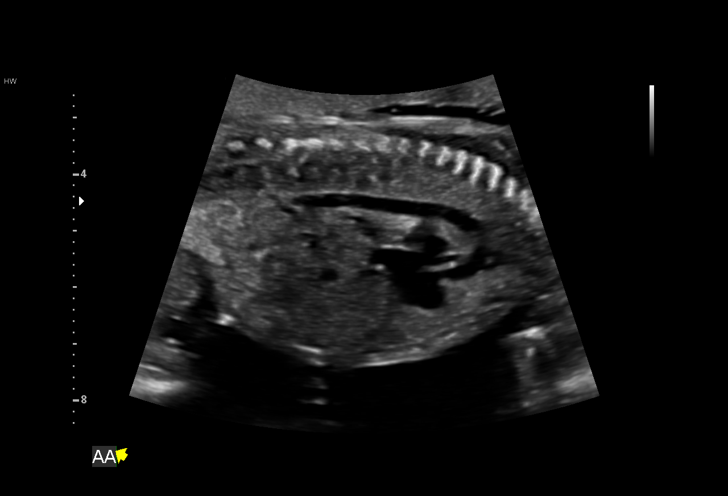
[im 24/72]
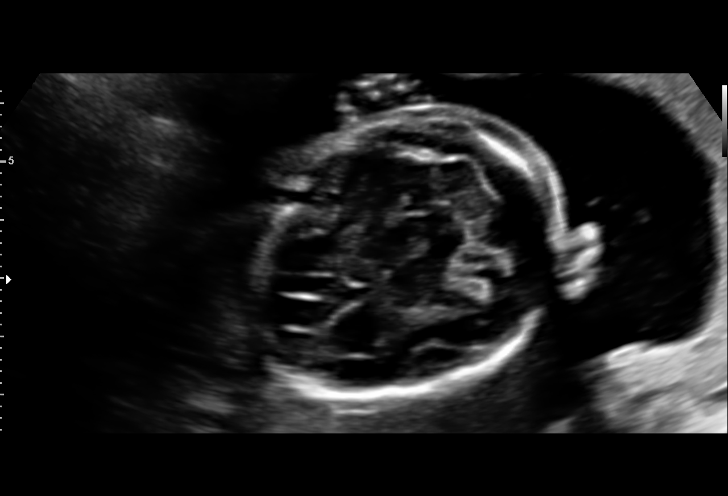
[im 29/72]
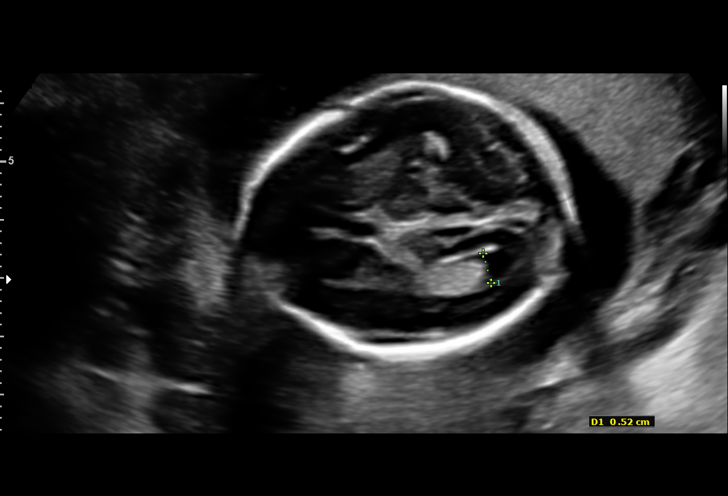
[im 37/72]
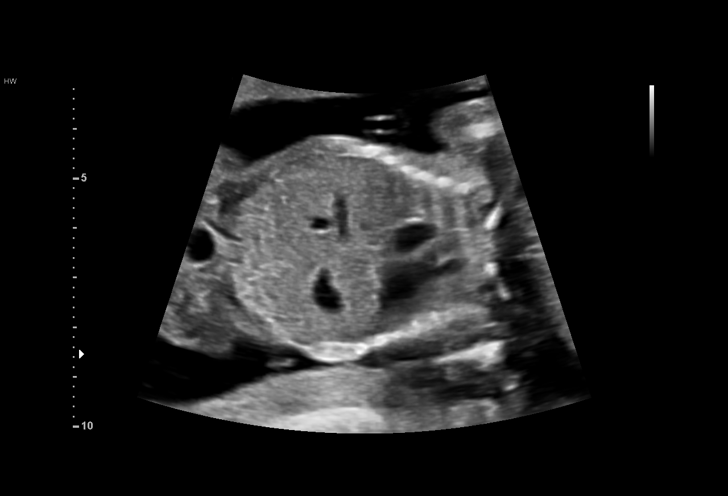
[im 43/72]
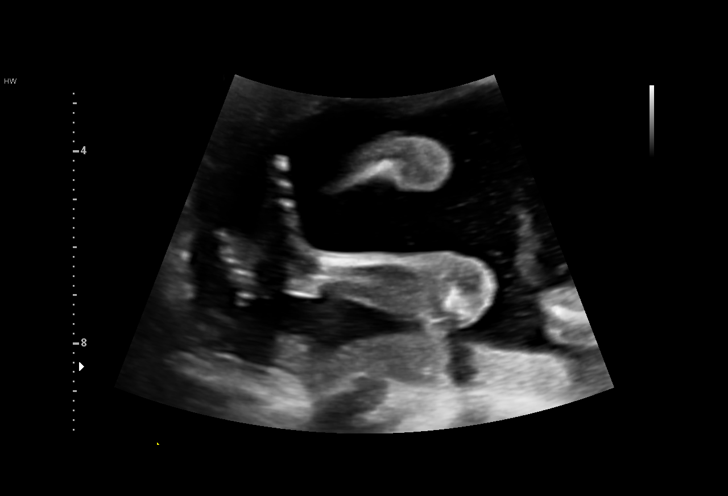
[im 48/72]
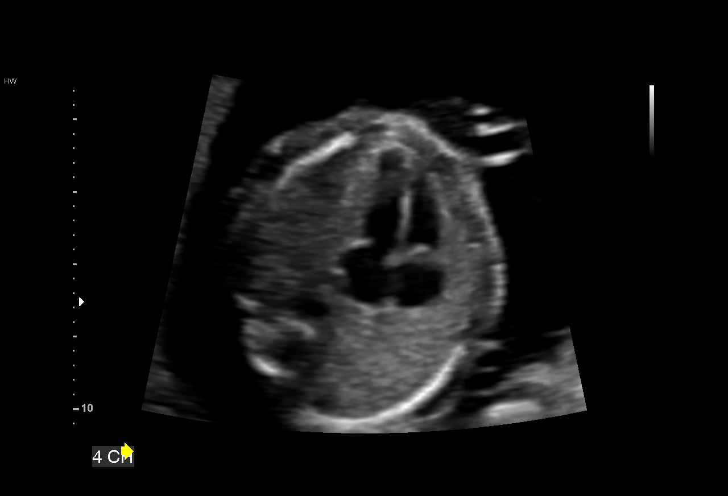
[im 53/72]
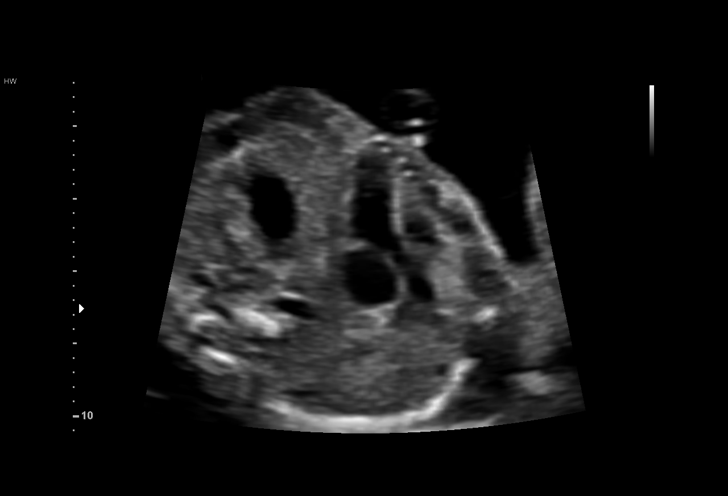
[im 58/72]
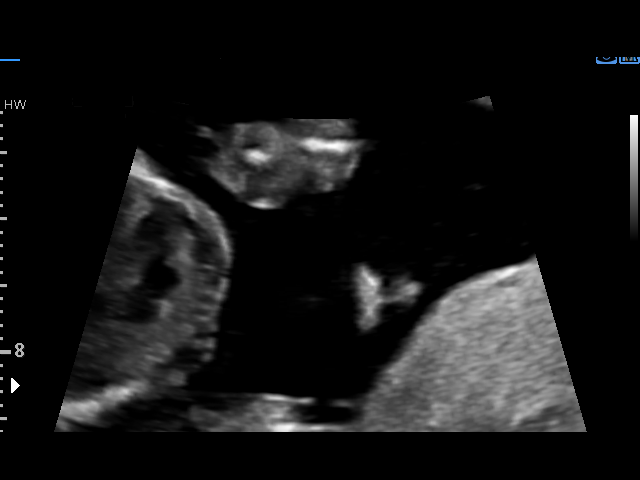
[im 64/72]
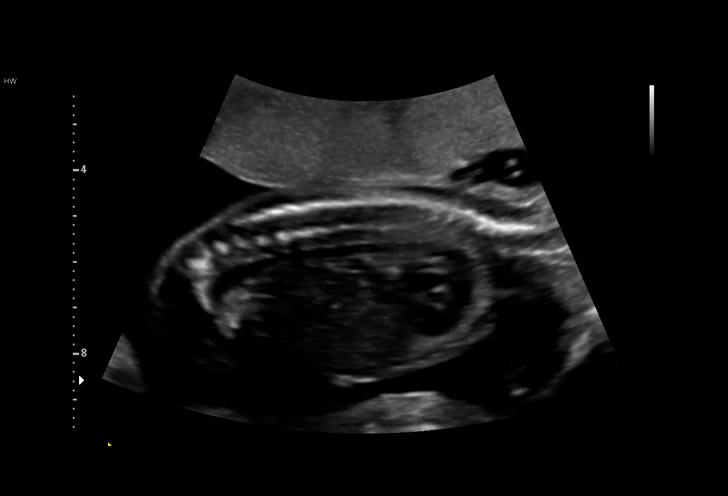
[im 69/72]
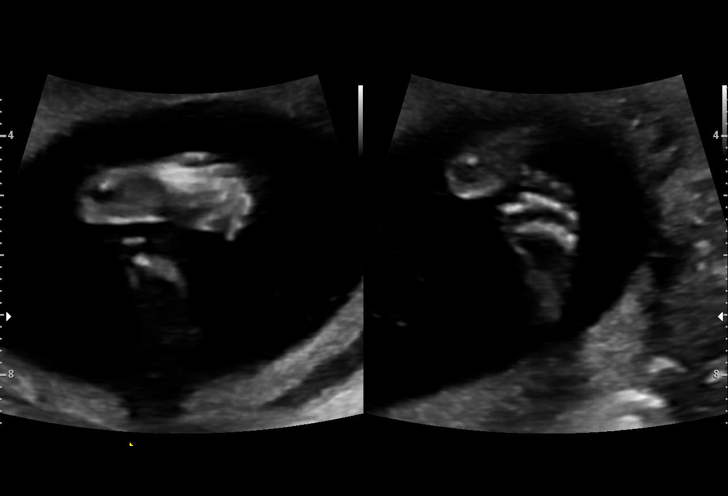

[13 of 28 positions shown; findings below may reference images not displayed]

[address]

 ----------------------------------------------------------------------

 ----------------------------------------------------------------------
Indications

  19 weeks gestation of pregnancy
  Encounter for antenatal screening for
  malformations (no testing in chart)
  Encounter for uncertain dates
 ----------------------------------------------------------------------
Fetal Evaluation

 Num Of Fetuses:         1
 Fetal Heart Rate(bpm):  138
 Cardiac Activity:       Observed
 Presentation:           Cephalic
 Placenta:               Anterior
 P. Cord Insertion:      Visualized, central

 Amniotic Fluid
 AFI FV:      Within normal limits

                             Largest Pocket(cm)

Biometry

 BPD:      45.9  mm     G. Age:  19w 6d         69  %    CI:        78.04   %    70 - 86
                                                         FL/HC:      17.1   %    16.1 -
 HC:      164.4  mm     G. Age:  19w 1d         30  %    HC/AC:      1.13        1.09 -
 AC:       146   mm     G. Age:  19w 6d         61  %    FL/BPD:     61.2   %
 FL:       28.1  mm     G. Age:  18w 4d         16  %    FL/AC:      19.2   %    20 - 24
 HUM:      28.6  mm     G. Age:  19w 2d         47  %
 CER:      18.6  mm     G. Age:  18w 2d         18  %
 NFT:       3.4  mm
 CM:        4.4  mm

 Est. FW:     285  gm    0 lb 10 oz      38  %
OB History

 Gravidity:    3         Term:   2        Prem:   0        SAB:   0
 TOP:          0       Ectopic:  0        Living: 2
Gestational Age

 LMP:           30w 2d        Date:  03/13/18                 EDD:   12/18/18
 U/S Today:     19w 3d                                        EDD:   03/04/19
 Best:          19w 3d     Det. By:  U/S (10/11/18)           EDD:   03/04/19
Anatomy

 Cranium:               Appears normal         Aortic Arch:            Appears normal
 Cavum:                 Appears normal         Ductal Arch:            Appears normal
 Ventricles:            Appears normal         Diaphragm:              Appears normal
 Choroid Plexus:        Appears normal         Stomach:                Appears normal, left
                                                                       sided
 Cerebellum:            Appears normal         Abdomen:                Appears normal
 Posterior Fossa:       Appears normal         Abdominal Wall:         Appears nml (cord
                                                                       insert, abd wall)
 Nuchal Fold:           Appears normal         Cord Vessels:           Appears normal (3
                                                                       vessel cord)
 Face:                  Appears normal         Kidneys:                Appear normal
                        (orbits and profile)
 Lips:                  Appears normal         Bladder:                Appears normal
 Thoracic:              Appears normal         Spine:                  Appears normal
 Heart:                 Appears normal         Upper Extremities:      Appears normal
                        (4CH, axis, and
                        situs)
 RVOT:                  Appears normal         Lower Extremities:      Appears normal
 LVOT:                  Appears normal

 Other:  Parents do not wish to know sex of fetus. Heels and 5th digit
         visualized. Nasal bone visualized. Technically difficult due to fetal
         position.
Cervix Uterus Adnexa

 Cervix
 Length:            3.6  cm.
 Normal appearance by transabdominal scan.

 Uterus
 No abnormality visualized.

 Left Ovary
 No adnexal mass visualized.

 Right Ovary
 No adnexal mass visualized.

 Cul De Sac
 No free fluid seen.

 Adnexa
 No abnormality visualized.
Impression

 Normal interval growth.  No ultrasonic evidence of structural
 fetal anomalies.
 Uncertain LMP, therefore the pregnancy is dated by today's
 examination
Recommendations

 Follow up growth as clinically indicated.

## 2020-10-22 ENCOUNTER — Ambulatory Visit: Payer: 59 | Admitting: Psychology

## 2020-10-30 ENCOUNTER — Ambulatory Visit (INDEPENDENT_AMBULATORY_CARE_PROVIDER_SITE_OTHER): Payer: 59 | Admitting: Psychology

## 2020-10-30 DIAGNOSIS — F429 Obsessive-compulsive disorder, unspecified: Secondary | ICD-10-CM | POA: Diagnosis not present

## 2020-10-30 DIAGNOSIS — F411 Generalized anxiety disorder: Secondary | ICD-10-CM

## 2020-11-05 ENCOUNTER — Ambulatory Visit (INDEPENDENT_AMBULATORY_CARE_PROVIDER_SITE_OTHER): Payer: 59 | Admitting: Psychology

## 2020-11-05 DIAGNOSIS — F411 Generalized anxiety disorder: Secondary | ICD-10-CM

## 2020-11-05 DIAGNOSIS — F429 Obsessive-compulsive disorder, unspecified: Secondary | ICD-10-CM | POA: Diagnosis not present

## 2020-11-19 ENCOUNTER — Ambulatory Visit (INDEPENDENT_AMBULATORY_CARE_PROVIDER_SITE_OTHER): Payer: 59 | Admitting: Psychology

## 2020-11-19 DIAGNOSIS — F429 Obsessive-compulsive disorder, unspecified: Secondary | ICD-10-CM | POA: Diagnosis not present

## 2020-11-19 DIAGNOSIS — F411 Generalized anxiety disorder: Secondary | ICD-10-CM

## 2020-12-03 ENCOUNTER — Ambulatory Visit (INDEPENDENT_AMBULATORY_CARE_PROVIDER_SITE_OTHER): Payer: 59 | Admitting: Psychology

## 2020-12-03 DIAGNOSIS — F429 Obsessive-compulsive disorder, unspecified: Secondary | ICD-10-CM

## 2020-12-03 DIAGNOSIS — F411 Generalized anxiety disorder: Secondary | ICD-10-CM

## 2020-12-17 ENCOUNTER — Ambulatory Visit: Payer: 59 | Admitting: Psychology

## 2020-12-31 ENCOUNTER — Ambulatory Visit: Payer: 59 | Admitting: Psychology

## 2021-01-14 ENCOUNTER — Ambulatory Visit: Payer: 59 | Admitting: Psychology

## 2021-02-05 ENCOUNTER — Ambulatory Visit: Payer: 59 | Admitting: Psychiatry

## 2021-02-11 ENCOUNTER — Ambulatory Visit: Payer: Self-pay | Admitting: Psychology

## 2021-02-25 ENCOUNTER — Ambulatory Visit: Payer: 59 | Admitting: Psychology

## 2021-03-11 ENCOUNTER — Ambulatory Visit: Payer: Self-pay | Admitting: Psychology

## 2021-03-25 ENCOUNTER — Ambulatory Visit: Payer: Self-pay | Admitting: Psychology

## 2021-04-08 ENCOUNTER — Ambulatory Visit: Payer: Self-pay | Admitting: Psychology

## 2021-04-22 ENCOUNTER — Ambulatory Visit: Payer: Self-pay | Admitting: Psychology

## 2021-05-01 ENCOUNTER — Ambulatory Visit (INDEPENDENT_AMBULATORY_CARE_PROVIDER_SITE_OTHER): Payer: Self-pay | Admitting: Certified Nurse Midwife

## 2021-05-01 DIAGNOSIS — Z30430 Encounter for insertion of intrauterine contraceptive device: Secondary | ICD-10-CM

## 2021-05-01 DIAGNOSIS — Z01419 Encounter for gynecological examination (general) (routine) without abnormal findings: Secondary | ICD-10-CM

## 2021-05-02 NOTE — Progress Notes (Signed)
No show, realized she does not yet have insurance. Will reschedule once she does. ? ?Edd Arbour, CNM, MSN, IBCLC ?Certified Nurse Midwife, [facility] ? ?  ?

## 2021-05-06 ENCOUNTER — Ambulatory Visit: Payer: Self-pay | Admitting: Psychology

## 2021-05-20 ENCOUNTER — Ambulatory Visit: Payer: Self-pay | Admitting: Psychology

## 2021-05-24 ENCOUNTER — Telehealth: Payer: Self-pay | Admitting: Psychiatry

## 2021-05-24 DIAGNOSIS — F422 Mixed obsessional thoughts and acts: Secondary | ICD-10-CM

## 2021-05-24 DIAGNOSIS — F4001 Agoraphobia with panic disorder: Secondary | ICD-10-CM

## 2021-05-24 DIAGNOSIS — F331 Major depressive disorder, recurrent, moderate: Secondary | ICD-10-CM

## 2021-05-24 MED ORDER — SERTRALINE HCL 100 MG PO TABS: 200.0000 mg | ORAL_TABLET | Freq: Every day | ORAL | 0 refills | Status: DC

## 2021-05-24 NOTE — Telephone Encounter (Signed)
Rx sent 

## 2021-05-24 NOTE — Telephone Encounter (Signed)
Pt called at 3:03 pm and made an appt for 6/12.She needs a refill on her zoloft 100 mg sent to a new pharmacy. [facility] located at [address] ?

## 2021-06-03 ENCOUNTER — Ambulatory Visit: Payer: Self-pay | Admitting: Psychology

## 2021-06-17 ENCOUNTER — Ambulatory Visit: Payer: Self-pay | Admitting: Psychology

## 2021-06-18 ENCOUNTER — Telehealth: Payer: Self-pay | Admitting: Psychiatry

## 2021-06-18 ENCOUNTER — Other Ambulatory Visit: Payer: Self-pay | Admitting: Psychiatry

## 2021-06-18 MED ORDER — ALPRAZOLAM 0.5 MG PO TABS: 0.5000 mg | ORAL_TABLET | Freq: Three times a day (TID) | ORAL | 0 refills | Status: DC | PRN

## 2021-06-18 NOTE — Telephone Encounter (Signed)
Pt lvm at 12:20 pm asking for a script of xanax to be sent to the [facility] on [address]. Her paper script expired

## 2021-06-19 ENCOUNTER — Ambulatory Visit (INDEPENDENT_AMBULATORY_CARE_PROVIDER_SITE_OTHER): Payer: 59 | Admitting: Certified Nurse Midwife

## 2021-06-19 ENCOUNTER — Encounter: Payer: Self-pay | Admitting: Certified Nurse Midwife

## 2021-06-19 ENCOUNTER — Other Ambulatory Visit (HOSPITAL_COMMUNITY)
Admission: RE | Admit: 2021-06-19 | Discharge: 2021-06-19 | Disposition: A | Discharge status: Home / Self Care | Payer: 59 | Source: Ambulatory Visit | Attending: Certified Nurse Midwife | Admitting: Certified Nurse Midwife

## 2021-06-19 VITALS — BP 107/62 | HR 81 | Wt 151.2 lb

## 2021-06-19 DIAGNOSIS — F332 Major depressive disorder, recurrent severe without psychotic features: Secondary | ICD-10-CM

## 2021-06-19 DIAGNOSIS — Z01411 Encounter for gynecological examination (general) (routine) with abnormal findings: Secondary | ICD-10-CM

## 2021-06-19 DIAGNOSIS — R69 Illness, unspecified: Secondary | ICD-10-CM | POA: Diagnosis not present

## 2021-06-19 DIAGNOSIS — Z30430 Encounter for insertion of intrauterine contraceptive device: Secondary | ICD-10-CM

## 2021-06-19 DIAGNOSIS — Z113 Encounter for screening for infections with a predominantly sexual mode of transmission: Secondary | ICD-10-CM

## 2021-06-19 DIAGNOSIS — Z01419 Encounter for gynecological examination (general) (routine) without abnormal findings: Secondary | ICD-10-CM

## 2021-06-19 DIAGNOSIS — Z3009 Encounter for other general counseling and advice on contraception: Secondary | ICD-10-CM

## 2021-06-19 DIAGNOSIS — B379 Candidiasis, unspecified: Secondary | ICD-10-CM | POA: Diagnosis not present

## 2021-06-19 LAB — POCT PREGNANCY, URINE: Preg Test, Ur: NEGATIVE

## 2021-06-19 MED ORDER — LEVONORGESTREL 20.1 MCG/DAY IU IUD
1.0000 | INTRAUTERINE_SYSTEM | Freq: Once | INTRAUTERINE | Status: AC
  Administered 2021-06-19: 1 via INTRAUTERINE

## 2021-06-19 NOTE — Progress Notes (Signed)
PHQ-9 and GAD-7 positive; reports SI/thoughts of harm. Report given to [place], CNM.  Apolonio Schneiders RN 06/19/21
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
## 2021-06-21 LAB — CYTOLOGY - PAP
Chlamydia: NEGATIVE
Comment: NEGATIVE
Comment: NEGATIVE
Comment: NEGATIVE
Comment: NORMAL
Diagnosis: NEGATIVE
High risk HPV: NEGATIVE
Neisseria Gonorrhea: NEGATIVE
Trichomonas: NEGATIVE

## 2021-06-25 ENCOUNTER — Encounter: Payer: Self-pay | Admitting: Certified Nurse Midwife

## 2021-06-25 LAB — CANDIDA 6 SPECIES PROFILE, NAA
C PARAPSILOSIS/TROPICALIS: NEGATIVE
Candida albicans, NAA: NEGATIVE
Candida glabrata, NAA: NEGATIVE
Candida krusei, NAA: NEGATIVE
Candida lusitaniae, NAA: NEGATIVE

## 2021-06-27 ENCOUNTER — Ambulatory Visit (INDEPENDENT_AMBULATORY_CARE_PROVIDER_SITE_OTHER): Payer: 59

## 2021-06-27 ENCOUNTER — Other Ambulatory Visit (HOSPITAL_COMMUNITY)
Admission: RE | Admit: 2021-06-27 | Discharge: 2021-06-27 | Disposition: A | Discharge status: Home / Self Care | Payer: 59 | Source: Ambulatory Visit | Attending: Family Medicine | Admitting: Family Medicine

## 2021-06-27 VITALS — BP 112/70 | HR 70 | Wt 148.5 lb

## 2021-06-27 DIAGNOSIS — N898 Other specified noninflammatory disorders of vagina: Secondary | ICD-10-CM | POA: Diagnosis present

## 2021-06-27 DIAGNOSIS — Z113 Encounter for screening for infections with a predominantly sexual mode of transmission: Secondary | ICD-10-CM | POA: Insufficient documentation

## 2021-06-27 DIAGNOSIS — R69 Illness, unspecified: Secondary | ICD-10-CM | POA: Diagnosis not present

## 2021-06-27 NOTE — Progress Notes (Signed)
Here today for vaginal self swab to screen for BV and for blood draw for routine STD screening. Pt will be contacted with any abnormal results.  Fleet Contras RN  06/27/21

## 2021-06-28 ENCOUNTER — Encounter: Payer: Self-pay | Admitting: Certified Nurse Midwife

## 2021-06-28 LAB — HEPATITIS B SURFACE ANTIGEN: Hepatitis B Surface Ag: NEGATIVE

## 2021-06-28 LAB — HIV ANTIBODY (ROUTINE TESTING W REFLEX): HIV Screen 4th Generation wRfx: NONREACTIVE

## 2021-06-28 LAB — HEPATITIS C ANTIBODY: Hep C Virus Ab: NONREACTIVE

## 2021-06-28 LAB — RPR: RPR Ser Ql: NONREACTIVE

## 2021-06-28 LAB — CERVICOVAGINAL ANCILLARY ONLY
Bacterial Vaginitis (gardnerella): NEGATIVE
Comment: NEGATIVE

## 2021-07-01 ENCOUNTER — Ambulatory Visit: Payer: Self-pay | Admitting: Psychology

## 2021-07-06 DIAGNOSIS — L03012 Cellulitis of left finger: Secondary | ICD-10-CM | POA: Diagnosis not present

## 2021-07-06 DIAGNOSIS — L03019 Cellulitis of unspecified finger: Secondary | ICD-10-CM | POA: Diagnosis not present

## 2021-07-08 ENCOUNTER — Telehealth (INDEPENDENT_AMBULATORY_CARE_PROVIDER_SITE_OTHER): Payer: 59 | Admitting: Psychiatry

## 2021-07-08 ENCOUNTER — Encounter: Payer: Self-pay | Admitting: Psychiatry

## 2021-07-08 DIAGNOSIS — F902 Attention-deficit hyperactivity disorder, combined type: Secondary | ICD-10-CM

## 2021-07-08 DIAGNOSIS — F331 Major depressive disorder, recurrent, moderate: Secondary | ICD-10-CM | POA: Diagnosis not present

## 2021-07-08 DIAGNOSIS — F4001 Agoraphobia with panic disorder: Secondary | ICD-10-CM

## 2021-07-08 DIAGNOSIS — F422 Mixed obsessional thoughts and acts: Secondary | ICD-10-CM

## 2021-07-08 DIAGNOSIS — R69 Illness, unspecified: Secondary | ICD-10-CM | POA: Diagnosis not present

## 2021-07-08 MED ORDER — SERTRALINE HCL 100 MG PO TABS: 250.0000 mg | ORAL_TABLET | Freq: Every day | ORAL | 0 refills | Status: DC
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
## 2021-07-12 ENCOUNTER — Ambulatory Visit
Admission: EM | Admit: 2021-07-12 | Discharge: 2021-07-12 | Disposition: A | Discharge status: Home / Self Care | Payer: 59 | Attending: Emergency Medicine | Admitting: Emergency Medicine

## 2021-07-12 DIAGNOSIS — Z20818 Contact with and (suspected) exposure to other bacterial communicable diseases: Secondary | ICD-10-CM | POA: Diagnosis not present

## 2021-07-12 DIAGNOSIS — R0981 Nasal congestion: Secondary | ICD-10-CM | POA: Diagnosis not present

## 2021-07-12 DIAGNOSIS — Z8709 Personal history of other diseases of the respiratory system: Secondary | ICD-10-CM | POA: Diagnosis not present

## 2021-07-12 DIAGNOSIS — Z209 Contact with and (suspected) exposure to unspecified communicable disease: Secondary | ICD-10-CM | POA: Diagnosis not present

## 2021-07-12 LAB — POCT RAPID STREP A (OFFICE): Rapid Strep A Screen: NEGATIVE

## 2021-07-12 NOTE — Discharge Instructions (Signed)
You were tested for COVID-19 today.  The result of your COVID-19 test will be posted to your MyChart once it is complete, typically this takes 36 to 48 hours.  If it is positive, you will be contacted by phone.  If you find a home COVID-19 test, please call us back here at the clinic at [phone] we will cancel the COVID-19 test that performed here.    If your COVID-19 test is positive, please let us know whether you are interested in taking the recommended antiviral for COVID-19 called Paxlovid.    Your strep test today is negative.  Streptococcal throat culture will be performed per our protocol.  The result of your throat culture will be posted to your MyChart once it is complete, this typically takes 3 to 5 days.   If your streptococcal throat culture is positive, you will be contacted by phone and antibiotics will be prescribed for you.     Based on my physical exam findings and the history you have provided  today, I do not recommend antibiotics at this time.  I do not believe the risks and side effects of antibiotics would outweigh any minimal benefit that they might provide.       Below are listed some medications that I believe may be helpful for your current symptoms:  Zyrtec (cetirizine): This is an excellent second-generation antihistamine that helps to reduce respiratory inflammatory response to environmental allergens.  In some patients, this medication can cause daytime sleepiness so I recommend that you take 1 tablet daily at bedtime.     Flonase (fluticasone): This is a steroid nasal spray that you use once daily, 1 spray in each nare.  This medication does not work well if you decide to use it only used as you feel you need to, it works best used on a daily basis.  After 3 to 5 days of use, you will notice significant reduction of the inflammation and mucus production that is currently being caused by exposure to allergens, whether seasonal or environmental.  The most common side  effect of this medication is nosebleeds.  If you experience a nosebleed, please discontinue use for 1 week, then feel free to resume.  I have provided you with a prescription.     Advil, Motrin (ibuprofen): This is a good anti-inflammatory medication which not only addresses aches, pains but also significantly reduces soft tissue inflammation of the upper airways that causes sinus and nasal congestion as well as inflammation of the lower airways which makes you feel like your breathing is constricted or your cough feel tight.  I recommend that you take between 400 mg every 8 hours as needed.       Please follow-up within the next 7-10 days either with your primary care provider or urgent care if your symptoms do not resolve.  If you do not have a primary care provider, we will assist you in finding one.   Thank you for visiting urgent care today.  We appreciate the opportunity to participate in your care.

## 2021-07-12 NOTE — ED Provider Notes (Signed)
[facility]    CSN: [id number] Arrival date & time: 07/12/21  1324    HISTORY   Chief Complaint  Patient presents with   Sore Throat    Test for strep - Entered by patient   HPI Gina Gillespie is a 31 y.o. female. Patient requesting to be tested for strep, she denies having any symptoms at this time: No sore throat, no headache, no nausea, no fever. The patient states she had strep twice a few months ago and was exposed to strep throat again 2 weeks ago.   The history is provided by the patient.   Past Medical History:  Diagnosis Date   Anxiety    Depression    History of chicken pox    OCD (obsessive compulsive disorder)    Patient Active Problem List   Diagnosis Date Noted   Stressful life event affecting family 08/27/2018   Supervision of low-risk pregnancy 08/11/2018   Severe recurrent major depression without psychotic features (HCC) 07/05/2018   GAD (generalized anxiety disorder) 03/22/2018   Bilateral leg pain 08/09/2017   History reviewed. No pertinent surgical history. OB History     Gravida  3   Para  2   Term  2   Preterm      AB      Living  2      SAB      IAB      Ectopic      Multiple      Live Births  2          Home Medications    Prior to Admission medications   Medication Sig Start Date End Date Taking? Authorizing Provider  ALPRAZolam Prudy Feeler) 0.5 MG tablet Take 1 tablet (0.5 mg total) by mouth 3 (three) times daily as needed for anxiety. 06/18/21   Cottle, Steva Ready., MD  sertraline (ZOLOFT) 100 MG tablet Take 2.5 tablets (250 mg total) by mouth daily. 07/08/21   Cottle, Steva Ready., MD   Family History Family History  Problem Relation Age of Onset   Alcohol abuse Father    Cancer Father    Depression Father    Mental illness Father    Throat cancer Father    Mental illness Brother    Arthritis Maternal Grandmother    Depression Maternal Grandmother    Diabetes Maternal Grandmother    Hearing loss  Maternal Grandmother    Hypertension Maternal Grandmother    Alcohol abuse Maternal Grandfather    Cancer Maternal Grandfather    Early death Paternal Grandmother    Alcohol abuse Paternal Grandfather    Cancer Paternal Grandfather    Social History Social History   Tobacco Use   Smoking status: Never   Smokeless tobacco: Never  Vaping Use   Vaping Use: Never used  Substance Use Topics   Alcohol use: Never   Drug use: Never   Allergies   Patient has no known allergies.  Review of Systems Review of Systems Pertinent findings noted in history of present illness.   Physical Exam Triage Vital Signs ED Triage Vitals  Enc Vitals Group     BP 11/23/20 0827 (!) 147/82     Pulse Rate 11/23/20 0827 72     Resp 11/23/20 0827 18     Temp 11/23/20 0827 98.3 F (36.8 C)     Temp Source 11/23/20 0827 Oral     SpO2 11/23/20 0827 98 %     Weight --  Height --      Head Circumference --      Peak Flow --      Pain Score 11/23/20 0826 5     Pain Loc --      Pain Edu? --      Excl. in GC? --   No data found.  Updated Vital Signs BP 101/69 (BP Location: Left Arm)   Pulse 89   Temp 98.3 F (36.8 C) (Oral)   Resp 16   LMP 06/22/2021 (Exact Date)   SpO2 98%   Physical Exam Vitals and nursing note reviewed.  Constitutional:      General: She is not in acute distress.    Appearance: Normal appearance. She is not ill-appearing.  HENT:     Head: Normocephalic and atraumatic.     Salivary Glands: Right salivary gland is not diffusely enlarged or tender. Left salivary gland is not diffusely enlarged or tender.     Right Ear: Tympanic membrane, ear canal and external ear normal. No drainage. No middle ear effusion. There is no impacted cerumen. Tympanic membrane is not erythematous or bulging.     Left Ear: Tympanic membrane, ear canal and external ear normal. No drainage.  No middle ear effusion. There is no impacted cerumen. Tympanic membrane is not erythematous or bulging.      Nose: Nose normal. No nasal deformity, septal deviation, mucosal edema, congestion or rhinorrhea.     Right Turbinates: Not enlarged, swollen or pale.     Left Turbinates: Not enlarged, swollen or pale.     Right Sinus: No maxillary sinus tenderness or frontal sinus tenderness.     Left Sinus: No maxillary sinus tenderness or frontal sinus tenderness.     Mouth/Throat:     Lips: Pink. No lesions.     Mouth: Mucous membranes are moist. No oral lesions.     Pharynx: Oropharynx is clear. Uvula midline. No posterior oropharyngeal erythema or uvula swelling.     Tonsils: No tonsillar exudate. 0 on the right. 0 on the left.  Eyes:     General: Lids are normal.        Right eye: No discharge.        Left eye: No discharge.     Extraocular Movements: Extraocular movements intact.     Conjunctiva/sclera: Conjunctivae normal.     Right eye: Right conjunctiva is not injected.     Left eye: Left conjunctiva is not injected.  Neck:     Trachea: Trachea and phonation normal.  Cardiovascular:     Rate and Rhythm: Normal rate and regular rhythm.     Pulses: Normal pulses.     Heart sounds: Normal heart sounds. No murmur heard.    No friction rub. No gallop.  Pulmonary:     Effort: Pulmonary effort is normal. No accessory muscle usage, prolonged expiration or respiratory distress.     Breath sounds: Normal breath sounds. No stridor, decreased air movement or transmitted upper airway sounds. No decreased breath sounds, wheezing, rhonchi or rales.  Chest:     Chest wall: No tenderness.  Musculoskeletal:        General: Normal range of motion.     Cervical back: Normal range of motion and neck supple. Normal range of motion.  Lymphadenopathy:     Cervical: No cervical adenopathy.  Skin:    General: Skin is warm and dry.     Findings: No erythema or rash.  Neurological:     General: No focal  deficit present.     Mental Status: She is alert and oriented to person, place, and time.   Psychiatric:        Mood and Affect: Mood normal.        Behavior: Behavior normal.     Visual Acuity Right Eye Distance:   Left Eye Distance:   Bilateral Distance:    Right Eye Near:   Left Eye Near:    Bilateral Near:     UC Couse / Diagnostics / Procedures:    EKG  Radiology No results found.  Procedures Procedures (including critical care time)  UC Diagnoses / Final Clinical Impressions(s)   I have reviewed the triage vital signs and the nursing notes.  Pertinent labs & imaging results that were available during my care of the patient were reviewed by me and considered in my medical decision making (see chart for details).   Final diagnoses:  History of streptococcal pharyngitis  Contact with and suspected exposure to communicable disease  Exposure to Streptococcal pharyngitis  Nasal congestion   Rapid strep test negative, throat culture pending, will treat based on culture results.  COVID-19 test performed, will advise patient of results once received, patient is a candidate for Paxlovid if positive.  Return precautions advised.  Conservative care recommended. ED Prescriptions   None    PDMP not reviewed this encounter.  Pending results:  Labs Reviewed  CULTURE, GROUP A STREP ([facility])  NOVEL CORONAVIRUS, NAA  POCT RAPID STREP A (OFFICE)    Medications Ordered in UC: Medications - No data to display  Disposition Upon Discharge:  Condition: stable for discharge home Home: take medications as prescribed; routine discharge instructions as discussed; follow up as advised.  Patient presented with an acute illness with associated systemic symptoms and significant discomfort requiring urgent management. In my opinion, this is a condition that a prudent lay person (someone who possesses an average knowledge of health and medicine) may potentially expect to result in complications if not addressed urgently such as respiratory distress, impairment of bodily function  or dysfunction of bodily organs.   Routine symptom specific, illness specific and/or disease specific instructions were discussed with the patient and/or caregiver at length.   As such, the patient has been evaluated and assessed, work-up was performed and treatment was provided in alignment with urgent care protocols and evidence based medicine.  Patient/parent/caregiver has been advised that the patient may require follow up for further testing and treatment if the symptoms continue in spite of treatment, as clinically indicated and appropriate.  If the patient was tested for COVID-19, Influenza and/or RSV, then the patient/parent/guardian was advised to isolate at home pending the results of his/her diagnostic coronavirus test and potentially longer if they're positive. I have also advised pt that if his/her COVID-19 test returns positive, it's recommended to self-isolate for at least 10 days after symptoms first appeared AND until fever-free for 24 hours without fever reducer AND other symptoms have improved or resolved. Discussed self-isolation recommendations as well as instructions for household member/close contacts as per the [facility], and also gave patient the COVID packet with this information.  Patient/parent/caregiver has been advised to return to the [facility] or PCP in 3-5 days if no better; to PCP or the Emergency Department if new signs and symptoms develop, or if the current signs or symptoms continue to change or worsen for further workup, evaluation and treatment as clinically indicated and appropriate  The patient will follow up with their current  PCP if and as advised. If the patient does not currently have a PCP we will assist them in obtaining one.   The patient may need specialty follow up if the symptoms continue, in spite of conservative treatment and management, for further workup, evaluation, consultation and treatment as clinically indicated and  appropriate.  Patient/parent/caregiver verbalized understanding and agreement of plan as discussed.  All questions were addressed during visit.  Please see discharge instructions below for further details of plan.  Discharge Instructions:   Discharge Instructions      You were tested for COVID-19 today.  The result of your COVID-19 test will be posted to your MyChart once it is complete, typically this takes 36 to 48 hours.  If it is positive, you will be contacted by phone.  If you find a home COVID-19 test, please call us back here at the clinic at [phone] we will cancel the COVID-19 test that performed here.    If your COVID-19 test is positive, please let us know whether you are interested in taking the recommended antiviral for COVID-19 called Paxlovid.    Your strep test today is negative.  Streptococcal throat culture will be performed per our protocol.  The result of your throat culture will be posted to your MyChart once it is complete, this typically takes 3 to 5 days.   If your streptococcal throat culture is positive, you will be contacted by phone and antibiotics will be prescribed for you.     Based on my physical exam findings and the history you have provided  today, I do not recommend antibiotics at this time.  I do not believe the risks and side effects of antibiotics would outweigh any minimal benefit that they might provide.       Below are listed some medications that I believe may be helpful for your current symptoms:  Zyrtec (cetirizine): This is an excellent second-generation antihistamine that helps to reduce respiratory inflammatory response to environmental allergens.  In some patients, this medication can cause daytime sleepiness so I recommend that you take 1 tablet daily at bedtime.     Flonase (fluticasone): This is a steroid nasal spray that you use once daily, 1 spray in each nare.  This medication does not work well if you decide to use it only used as  you feel you need to, it works best used on a daily basis.  After 3 to 5 days of use, you will notice significant reduction of the inflammation and mucus production that is currently being caused by exposure to allergens, whether seasonal or environmental.  The most common side effect of this medication is nosebleeds.  If you experience a nosebleed, please discontinue use for 1 week, then feel free to resume.  I have provided you with a prescription.     Advil, Motrin (ibuprofen): This is a good anti-inflammatory medication which not only addresses aches, pains but also significantly reduces soft tissue inflammation of the upper airways that causes sinus and nasal congestion as well as inflammation of the lower airways which makes you feel like your breathing is constricted or your cough feel tight.  I recommend that you take between 400 mg every 8 hours as needed.       Please follow-up within the next 7-10 days either with your primary care provider or urgent care if your symptoms do not resolve.  If you do not have a primary care provider, we will assist you in finding one.  Thank you for visiting urgent care today.  We appreciate the opportunity to participate in your care.       This office note has been dictated using Teaching laboratory technicianDragon speech recognition software.  Unfortunately, and despite my best efforts, this method of dictation can sometimes lead to occasional typographical or grammatical errors.  I apologize in advance if this occurs.     Theadora RamaMorgan, Filemon Breton Scales, [place] JerseyPA-C 07/12/21 [phone]

## 2021-07-12 NOTE — ED Triage Notes (Signed)
Patient requesting to be tested for strep, she denies having any symptoms at this time. The patient states she had strep a few weeks ago.

## 2021-07-14 LAB — NOVEL CORONAVIRUS, NAA: SARS-CoV-2, NAA: NOT DETECTED

## 2021-07-15 ENCOUNTER — Ambulatory Visit: Payer: Self-pay | Admitting: Psychology

## 2021-07-15 ENCOUNTER — Telehealth: Payer: Self-pay | Admitting: Psychiatry

## 2021-07-15 ENCOUNTER — Other Ambulatory Visit: Payer: Self-pay

## 2021-07-15 DIAGNOSIS — F422 Mixed obsessional thoughts and acts: Secondary | ICD-10-CM

## 2021-07-15 DIAGNOSIS — F331 Major depressive disorder, recurrent, moderate: Secondary | ICD-10-CM

## 2021-07-15 DIAGNOSIS — F4001 Agoraphobia with panic disorder: Secondary | ICD-10-CM

## 2021-07-15 LAB — CULTURE, GROUP A STREP (THRC)

## 2021-07-15 MED ORDER — SERTRALINE HCL 100 MG PO TABS: 250.0000 mg | ORAL_TABLET | Freq: Every day | ORAL | 0 refills | Status: DC

## 2021-07-15 NOTE — Telephone Encounter (Signed)
Next visit is 08/26/21. Gina Gillespie said that [employer] participate with [employer] anymore. She needs her Sertraline that was called into [facility] be sent to [facility], [address] [place], [place] [address]. Phone number is [phone].

## 2021-07-15 NOTE — Telephone Encounter (Signed)
Rx sent 

## 2021-07-17 ENCOUNTER — Ambulatory Visit: Payer: 59 | Admitting: Certified Nurse Midwife

## 2021-07-17 ENCOUNTER — Other Ambulatory Visit: Payer: Self-pay

## 2021-07-17 ENCOUNTER — Encounter: Payer: Self-pay | Admitting: Certified Nurse Midwife

## 2021-07-17 VITALS — BP 96/61 | HR 70 | Wt 151.0 lb

## 2021-07-17 DIAGNOSIS — Z30431 Encounter for routine checking of intrauterine contraceptive device: Secondary | ICD-10-CM | POA: Diagnosis not present

## 2021-07-17 DIAGNOSIS — N761 Subacute and chronic vaginitis: Secondary | ICD-10-CM

## 2021-07-17 DIAGNOSIS — Z975 Presence of (intrauterine) contraceptive device: Secondary | ICD-10-CM | POA: Diagnosis not present

## 2021-07-17 DIAGNOSIS — N644 Mastodynia: Secondary | ICD-10-CM | POA: Diagnosis not present

## 2021-07-17 NOTE — Progress Notes (Signed)
   Subjective:   Patient Name: Gina Gillespie, female   DOB: 10-07-1990, 31 y.o.  MRN: [id number]  HPI Patient here for an IUD string check.  She had the Liletta IUD placed 1 month ago.  She reports getting her period the day after IUD insertion which was normal, but she has continued spotting daily. Denies cramping.   Wants to discuss copious discharge without odor, sometimes with irritation that goes away with use of OTC topical vaginitis cream.  Reports bilateral intermittent, shooting breast pain for the past 5 days that feels similar to let down or breast fullness. Stopped breastfeeding 4mo ago, had been breastfeeding for 5 years prior.  Review of Systems  .Pertinent items noted in HPI and remainder of comprehensive ROS otherwise negative.   Objective:   Physical Exam  Constitutional: She appears well-developed and well-nourished.  Head: Normocephalic and atraumatic.  Abdominal: Soft. There is no tenderness. There is no guarding.  Breast: normal appearing breasts, no tenderness, rash, induration or edematous tissue. No nodules or swollen glandular tissue. Genitourinary: There is no rash, tenderness or lesion on the right labia. There is no rash, tenderness or lesion on the left labia. No erythema or tenderness in the vagina. No foreign body around the vagina. No signs of injury around the vagina. Physiologic discharge found.    Skin: Skin is warm and dry.  Psychiatric: She has a normal mood and affect. Her behavior is normal. Judgment and thought content normal.      Assessment & Plan:  1. Chronic vaginitis - All testing normal, negative for STIs, BV or yeast. Discharge on last exam (and today) looked physiologic in nature - Discussed possibility that this is normal physiologic discharge and reviewed ways to encourage healthy discharge (evening primrose oil weekly, low dose boric acid after menses, probiotic and avoiding tight underwear).  2. IUD in place - IUD in place.   Counseled to feel for strings monthly.  3. Breast pain - Likely related to involution, - Advised pt to keep track of pain - could be cyclical and related to cycle. Follow up if pain continues or she notices new changes.  Follow up PRN  Edd Arbour, CNM, MSN, IBCLC Certified Nurse Midwife, [facility]

## 2021-07-17 NOTE — Progress Notes (Signed)
Complain of pain on R Breast IUD String check, couple spot but no bleeding

## 2021-07-29 ENCOUNTER — Ambulatory Visit: Payer: Self-pay | Admitting: Psychology

## 2021-08-26 ENCOUNTER — Encounter: Payer: Self-pay | Admitting: Psychiatry

## 2021-08-26 ENCOUNTER — Telehealth (INDEPENDENT_AMBULATORY_CARE_PROVIDER_SITE_OTHER): Payer: 59 | Admitting: Psychiatry

## 2021-08-26 DIAGNOSIS — F422 Mixed obsessional thoughts and acts: Secondary | ICD-10-CM

## 2021-08-26 DIAGNOSIS — F4001 Agoraphobia with panic disorder: Secondary | ICD-10-CM

## 2021-08-26 DIAGNOSIS — F331 Major depressive disorder, recurrent, moderate: Secondary | ICD-10-CM

## 2021-08-26 DIAGNOSIS — F902 Attention-deficit hyperactivity disorder, combined type: Secondary | ICD-10-CM | POA: Diagnosis not present

## 2021-08-26 DIAGNOSIS — R69 Illness, unspecified: Secondary | ICD-10-CM | POA: Diagnosis not present

## 2021-08-26 MED ORDER — SERTRALINE HCL 100 MG PO TABS: 300.0000 mg | ORAL_TABLET | Freq: Every day | ORAL | 0 refills | Status: DC
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
## 2021-08-27 ENCOUNTER — Ambulatory Visit (INDEPENDENT_AMBULATORY_CARE_PROVIDER_SITE_OTHER): Admission: EM | Admit: 2021-08-27 | Payer: 59 | Source: Home / Self Care

## 2021-08-27 ENCOUNTER — Encounter: Payer: Self-pay | Admitting: Emergency Medicine

## 2021-08-27 ENCOUNTER — Ambulatory Visit
Admission: EM | Admit: 2021-08-27 | Discharge: 2021-08-27 | Disposition: A | Discharge status: Home / Self Care | Payer: 59 | Attending: Urgent Care | Admitting: Urgent Care

## 2021-08-27 DIAGNOSIS — K0889 Other specified disorders of teeth and supporting structures: Secondary | ICD-10-CM | POA: Diagnosis not present

## 2021-08-27 DIAGNOSIS — K011 Impacted teeth: Secondary | ICD-10-CM | POA: Diagnosis not present

## 2021-08-27 MED ORDER — NAPROXEN 500 MG PO TABS: 500.0000 mg | ORAL_TABLET | Freq: Two times a day (BID) | ORAL | 0 refills | Status: DC

## 2021-08-27 MED ORDER — AMOXICILLIN 875 MG PO TABS: 875.0000 mg | ORAL_TABLET | Freq: Two times a day (BID) | ORAL | 0 refills | Status: DC

## 2021-08-27 NOTE — ED Triage Notes (Signed)
Pt here with right lower dental pain radiating to neck x 2 days.

## 2021-08-27 NOTE — ED Provider Notes (Addendum)
[facility]   MRN: [id number] DOB: [birth year]  Subjective:   Gina Gillespie is a 31 y.o. female presenting for 2-day history of pain along the right side of her posterior molar.  She is worried about an impacted molar.  Plans on following up with her dentist.  No facial pain, swelling, drainage or bleeding.  No current facility-administered medications for this encounter.  Current Outpatient Medications:    ALPRAZolam (XANAX) 0.5 MG tablet, Take 1 tablet (0.5 mg total) by mouth 3 (three) times daily as needed for anxiety., Disp: 15 tablet, Rfl: 0   sertraline (ZOLOFT) 100 MG tablet, Take 3 tablets (300 mg total) by mouth daily., Disp: 270 tablet, Rfl: 0   No Known Allergies  Past Medical History:  Diagnosis Date   Anxiety    Depression    History of chicken pox    OCD (obsessive compulsive disorder)      No past surgical history on file.  Family History  Problem Relation Age of Onset   Alcohol abuse Father    Cancer Father    Depression Father    Mental illness Father    Throat cancer Father    Mental illness Brother    Arthritis Maternal Grandmother    Depression Maternal Grandmother    Diabetes Maternal Grandmother    Hearing loss Maternal Grandmother    Hypertension Maternal Grandmother    Alcohol abuse Maternal Grandfather    Cancer Maternal Grandfather    Early death Paternal Grandmother    Alcohol abuse Paternal Grandfather    Cancer Paternal Grandfather     Social History   Tobacco Use   Smoking status: Never   Smokeless tobacco: Never  Vaping Use   Vaping Use: Never used  Substance Use Topics   Alcohol use: Never   Drug use: Never    ROS   Objective:   Vitals: BP 113/72   Pulse (!) 58   Temp 98.7 F (37.1 C)   Resp 18   SpO2 98%   Physical Exam Constitutional:      General: She is not in acute distress.    Appearance: Normal appearance. She is well-developed. She is not ill-appearing, toxic-appearing or  diaphoretic.  HENT:     Head: Normocephalic and atraumatic.     Right Ear: Tympanic membrane, ear canal and external ear normal. There is no impacted cerumen.     Left Ear: Tympanic membrane, ear canal and external ear normal. There is no impacted cerumen.     Nose: Nose normal.     Mouth/Throat:     Mouth: Mucous membranes are moist.      Comments: No facial pain or swelling, redness or warmth. Eyes:     General: No scleral icterus.       Right eye: No discharge.        Left eye: No discharge.     Extraocular Movements: Extraocular movements intact.  Cardiovascular:     Rate and Rhythm: Normal rate.     Pulses: Normal pulses.     Heart sounds: Normal heart sounds. No murmur heard.    No friction rub. No gallop.  Pulmonary:     Effort: Pulmonary effort is normal. No respiratory distress.     Breath sounds: Normal breath sounds. No stridor. No wheezing, rhonchi or rales.  Chest:     Chest wall: No tenderness.  Skin:    General: Skin is warm and dry.  Neurological:     General:  No focal deficit present.     Mental Status: She is alert and oriented to person, place, and time.  Psychiatric:        Mood and Affect: Mood normal.        Behavior: Behavior normal.        Thought Content: Thought content normal.        Judgment: Judgment normal.     Assessment and Plan :   PDMP not reviewed this encounter.  1. Pain, dental   2. Impacted molar    Start Augmentin for dental infection, use naproxen for pain and inflammation. Emphasized need for follow-up with her dental specialist. Counseled patient on potential for adverse effects with medications prescribed/recommended today, strict ER and return-to-clinic precautions discussed, patient verbalized understanding.    Wallis Bamberg, PA-C 08/27/21 1306   At discharge patient reported that she has been having chest pain, difficulty breathing and is concerned that this is infection from her tooth.  She also has intermittent  bilateral ear pain and is worried that the dental problem is associated with an ear infection.  Denies fevers, heart racing, facial pain, facial swelling, history of respiratory disorders.  She does have a history of significant anxiety and recurrent major depression.  EXAM:  Physical Exam Constitutional:      General: She is not in acute distress.    Appearance: Normal appearance. She is well-developed. She is not ill-appearing, toxic-appearing or diaphoretic.  HENT:     Head: Normocephalic and atraumatic.     Right Ear: Tympanic membrane, ear canal and external ear normal. There is no impacted cerumen.     Left Ear: Tympanic membrane, ear canal and external ear normal. There is no impacted cerumen.     Nose: Nose normal.     Mouth/Throat:     Mouth: Mucous membranes are moist.      Comments: No facial pain or swelling, redness or warmth. Eyes:     General: No scleral icterus.       Right eye: No discharge.        Left eye: No discharge.     Extraocular Movements: Extraocular movements intact.  Cardiovascular:     Rate and Rhythm: Normal rate.     Pulses: Normal pulses.     Heart sounds: Normal heart sounds. No murmur heard.    No friction rub. No gallop.  Pulmonary:     Effort: Pulmonary effort is normal. No respiratory distress.     Breath sounds: Normal breath sounds. No stridor. No wheezing, rhonchi or rales.  Chest:     Chest wall: No tenderness.  Skin:    General: Skin is warm and dry.  Neurological:     General: No focal deficit present.     Mental Status: She is alert and oriented to person, place, and time.  Psychiatric:        Mood and Affect: Mood normal.        Behavior: Behavior normal.        Thought Content: Thought content normal.        Judgment: Judgment normal.     A&P:  I reassured patient.  No changes to treatment plan.  She has a clear cardiopulmonary exam and therefore will defer chest x-ray.  I suspect that her anxiety may be playing a role in her  physical symptoms.  Monitor for signs of systemic infection from her dental pain. Counseled patient on potential for adverse effects with medications prescribed/recommended today, ER and return-to-clinic  precautions discussed, patient verbalized understanding.    Wallis Bamberg, [place] 08/27/21 1313

## 2021-10-19 ENCOUNTER — Other Ambulatory Visit: Payer: Self-pay | Admitting: Psychiatry

## 2021-10-19 DIAGNOSIS — F422 Mixed obsessional thoughts and acts: Secondary | ICD-10-CM

## 2021-10-19 DIAGNOSIS — F4001 Agoraphobia with panic disorder: Secondary | ICD-10-CM

## 2021-10-19 DIAGNOSIS — F331 Major depressive disorder, recurrent, moderate: Secondary | ICD-10-CM

## 2021-11-08 ENCOUNTER — Ambulatory Visit
Admission: EM | Admit: 2021-11-08 | Discharge: 2021-11-08 | Disposition: A | Discharge status: Home / Self Care | Payer: 59 | Attending: Urgent Care | Admitting: Urgent Care

## 2021-11-08 DIAGNOSIS — Z1152 Encounter for screening for COVID-19: Secondary | ICD-10-CM | POA: Insufficient documentation

## 2021-11-08 DIAGNOSIS — B349 Viral infection, unspecified: Secondary | ICD-10-CM | POA: Insufficient documentation

## 2021-11-08 DIAGNOSIS — R070 Pain in throat: Secondary | ICD-10-CM | POA: Insufficient documentation

## 2021-11-08 DIAGNOSIS — Z20818 Contact with and (suspected) exposure to other bacterial communicable diseases: Secondary | ICD-10-CM

## 2021-11-08 LAB — POCT RAPID STREP A (OFFICE): Rapid Strep A Screen: NEGATIVE

## 2021-11-08 NOTE — ED Triage Notes (Signed)
Pt states sore throat and fever started today.

## 2021-11-08 NOTE — Discharge Instructions (Signed)
We will notify you of your test results as they arrive and may take between about 24 hours.  I encourage you to sign up for MyChart if you have not already done so as this can be the easiest way for [place] to communicate results to you online or through a phone app.  Generally, we only contact you if it is a positive test result.  In the meantime, if you develop worsening symptoms including fever, chest pain, shortness of breath despite our current treatment plan then please report to the emergency room as this may be a sign of worsening status from possible viral infection.  Otherwise, we will manage this as a viral syndrome. For sore throat or cough try using a honey-based tea. Use 3 teaspoons of honey with juice squeezed from half lemon. Place shaved pieces of ginger into 1/2-1 cup of water and warm over stove top. Then mix the ingredients and repeat every 4 hours as needed. Please take Tylenol 500mg -650mg  every 6 hours for aches and pains, fevers. Hydrate very well with at least 2 liters of water.  You can also use ibuprofen at 600mg  once every 6 hours as needed.  Eat light meals such as soups to replenish electrolytes and soft fruits, veggies. Start an antihistamine like Zyrtec (10mg  daily) for postnasal drainage, sinus congestion.  You can take this together with pseudoephedrine (Sudafed) at a dose of 60 mg 2-3 times a day as needed for the same kind of congestion.  Use the cough medications as needed.

## 2021-11-08 NOTE — ED Provider Notes (Signed)
[facility]  Note:  This document was prepared using Systems analyst and may include unintentional dictation errors.  MRN: [id number] DOB: Jun 15, 1990  Subjective:   Gina Gillespie is a 31 y.o. female presenting for 1 day history of fever and throat pain.  Patient had exposure to strep throat and would like to be tested for this.  She also wants a COVID test.  Presents with her son who has similar symptoms and had more direct exposure to strep.  Has also had a slight cough.  No chest pain, shortness of breath or wheezing.  No current facility-administered medications for this encounter.  Current Outpatient Medications:    ALPRAZolam (XANAX) 0.5 MG tablet, Take 1 tablet (0.5 mg total) by mouth 3 (three) times daily as needed for anxiety., Disp: 15 tablet, Rfl: 0   amoxicillin (AMOXIL) 875 MG tablet, Take 1 tablet (875 mg total) by mouth 2 (two) times daily., Disp: 14 tablet, Rfl: 0   naproxen (NAPROSYN) 500 MG tablet, Take 1 tablet (500 mg total) by mouth 2 (two) times daily with a meal., Disp: 30 tablet, Rfl: 0   sertraline (ZOLOFT) 100 MG tablet, TAKE 2&1/2 TABLETS BY MOUTH DAILY., Disp: 75 tablet, Rfl: 0   No Known Allergies  Past Medical History:  Diagnosis Date   Anxiety    Depression    History of chicken pox    OCD (obsessive compulsive disorder)      History reviewed. No pertinent surgical history.  Family History  Problem Relation Age of Onset   Alcohol abuse Father    Cancer Father    Depression Father    Mental illness Father    Throat cancer Father    Mental illness Brother    Arthritis Maternal Grandmother    Depression Maternal Grandmother    Diabetes Maternal Grandmother    Hearing loss Maternal Grandmother    Hypertension Maternal Grandmother    Alcohol abuse Maternal Grandfather    Cancer Maternal Grandfather    Early death Paternal Grandmother    Alcohol abuse Paternal Grandfather    Cancer Paternal  Grandfather     Social History   Tobacco Use   Smoking status: Never   Smokeless tobacco: Never  Vaping Use   Vaping Use: Never used  Substance Use Topics   Alcohol use: Never   Drug use: Never    ROS   Objective:   Vitals: BP 99/65 (BP Location: Left Arm)   Pulse 82   Temp 99.1 F (37.3 C) (Oral)   LMP 10/09/2021 (Approximate)   SpO2 96%   Physical Exam Constitutional:      General: She is not in acute distress.    Appearance: Normal appearance. She is well-developed and normal weight. She is not ill-appearing, toxic-appearing or diaphoretic.  HENT:     Head: Normocephalic and atraumatic.     Right Ear: Tympanic membrane, ear canal and external ear normal. No drainage or tenderness. No middle ear effusion. There is no impacted cerumen. Tympanic membrane is not erythematous.     Left Ear: Tympanic membrane, ear canal and external ear normal. No drainage or tenderness.  No middle ear effusion. There is no impacted cerumen. Tympanic membrane is not erythematous.     Nose: Nose normal. No congestion or rhinorrhea.     Mouth/Throat:     Mouth: Mucous membranes are moist. No oral lesions.     Pharynx: No pharyngeal swelling, oropharyngeal exudate, posterior oropharyngeal erythema or uvula swelling.  Tonsils: No tonsillar exudate or tonsillar abscesses.  Eyes:     General: No scleral icterus.       Right eye: No discharge.        Left eye: No discharge.     Extraocular Movements: Extraocular movements intact.     Right eye: Normal extraocular motion.     Left eye: Normal extraocular motion.     Conjunctiva/sclera: Conjunctivae normal.  Cardiovascular:     Rate and Rhythm: Normal rate and regular rhythm.     Heart sounds: Normal heart sounds. No murmur heard.    No friction rub. No gallop.  Pulmonary:     Effort: Pulmonary effort is normal. No respiratory distress.     Breath sounds: No stridor. No wheezing, rhonchi or rales.  Chest:     Chest wall: No tenderness.   Musculoskeletal:     Cervical back: Normal range of motion and neck supple.  Lymphadenopathy:     Cervical: No cervical adenopathy.  Skin:    General: Skin is warm and dry.  Neurological:     General: No focal deficit present.     Mental Status: She is alert and oriented to person, place, and time.  Psychiatric:        Mood and Affect: Mood normal.        Behavior: Behavior normal.     Results for orders placed or performed during the hospital encounter of 11/08/21 (from the past 24 hour(s))  POCT rapid strep A     Status: None   Collection Time: 11/08/21  5:26 PM  Result Value Ref Range   Rapid Strep A Screen Negative Negative    Assessment and Plan :   PDMP not reviewed this encounter.  1. Acute viral syndrome   2. Throat pain   3. Strep throat exposure     Will manage for viral illness such as viral URI, viral syndrome, viral rhinitis, COVID-19, viral pharyngitis. Recommended supportive care. Offered scripts for symptomatic relief. COVID 19 and strep culture are pending. Counseled patient on potential for adverse effects with medications prescribed/recommended today, ER and return-to-clinic precautions discussed, patient verbalized understanding.     Wallis Bamberg, PA-C 11/08/21 1737

## 2021-11-09 LAB — SARS CORONAVIRUS 2 (TAT 6-24 HRS): SARS Coronavirus 2: NEGATIVE

## 2021-11-10 ENCOUNTER — Telehealth: Payer: Self-pay | Admitting: Urgent Care

## 2021-11-10 LAB — CULTURE, GROUP A STREP (THRC)

## 2021-11-10 MED ORDER — AMOXICILLIN 500 MG PO CAPS: 500.0000 mg | ORAL_CAPSULE | Freq: Two times a day (BID) | ORAL | 0 refills | Status: DC

## 2021-11-10 NOTE — Telephone Encounter (Signed)
Patient tested positive for strep from the culture and will cover her with amoxicillin.

## 2021-11-18 ENCOUNTER — Telehealth (INDEPENDENT_AMBULATORY_CARE_PROVIDER_SITE_OTHER): Payer: 59 | Admitting: Psychiatry

## 2021-11-18 ENCOUNTER — Other Ambulatory Visit: Payer: Self-pay | Admitting: Psychiatry

## 2021-11-18 ENCOUNTER — Encounter: Payer: Self-pay | Admitting: Psychiatry

## 2021-11-18 DIAGNOSIS — F4001 Agoraphobia with panic disorder: Secondary | ICD-10-CM | POA: Diagnosis not present

## 2021-11-18 DIAGNOSIS — F422 Mixed obsessional thoughts and acts: Secondary | ICD-10-CM

## 2021-11-18 DIAGNOSIS — F331 Major depressive disorder, recurrent, moderate: Secondary | ICD-10-CM

## 2021-11-18 DIAGNOSIS — F902 Attention-deficit hyperactivity disorder, combined type: Secondary | ICD-10-CM | POA: Diagnosis not present

## 2021-11-18 DIAGNOSIS — R69 Illness, unspecified: Secondary | ICD-10-CM | POA: Diagnosis not present

## 2021-11-18 MED ORDER — ALPRAZOLAM 0.5 MG PO TABS: 0.5000 mg | ORAL_TABLET | Freq: Three times a day (TID) | ORAL | 0 refills | Status: DC | PRN

## 2021-11-18 MED ORDER — SERTRALINE HCL 100 MG PO TABS: 300.0000 mg | ORAL_TABLET | Freq: Every day | ORAL | 1 refills | Status: DC
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
## 2022-03-19 ENCOUNTER — Ambulatory Visit
Admission: EM | Admit: 2022-03-19 | Discharge: 2022-03-19 | Disposition: A | Discharge status: Home / Self Care | Payer: 59 | Attending: Urgent Care | Admitting: Urgent Care

## 2022-03-19 DIAGNOSIS — R070 Pain in throat: Secondary | ICD-10-CM | POA: Insufficient documentation

## 2022-03-19 DIAGNOSIS — J069 Acute upper respiratory infection, unspecified: Secondary | ICD-10-CM | POA: Diagnosis not present

## 2022-03-19 DIAGNOSIS — Z1152 Encounter for screening for COVID-19: Secondary | ICD-10-CM | POA: Insufficient documentation

## 2022-03-19 LAB — POCT RAPID STREP A (OFFICE): Rapid Strep A Screen: NEGATIVE

## 2022-03-19 MED ORDER — PSEUDOEPHEDRINE HCL 60 MG PO TABS: 60.0000 mg | ORAL_TABLET | Freq: Three times a day (TID) | ORAL | 0 refills | Status: DC | PRN

## 2022-03-19 MED ORDER — CETIRIZINE HCL 10 MG PO TABS: 10.0000 mg | ORAL_TABLET | Freq: Every day | ORAL | 0 refills | Status: DC

## 2022-03-19 MED ORDER — IBUPROFEN 600 MG PO TABS: 600.0000 mg | ORAL_TABLET | Freq: Four times a day (QID) | ORAL | 0 refills | Status: DC | PRN

## 2022-03-19 NOTE — ED Provider Notes (Signed)
[facility]  Note:  This document was prepared using Systems analyst and may include unintentional dictation errors.  MRN: [id number] DOB: August 28, 1990  Subjective:   Gina Gillespie is a 32 y.o. female presenting for 2-day history of sinus headaches, throat pain, painful swallowing.  No ear pain, cough, chest pain, shortness of breath, fever, body pains.  No current facility-administered medications for this encounter.  Current Outpatient Medications:    ALPRAZolam (XANAX) 0.5 MG tablet, Take 1 tablet (0.5 mg total) by mouth 3 (three) times daily as needed for anxiety., Disp: 15 tablet, Rfl: 0   sertraline (ZOLOFT) 100 MG tablet, Take 3 tablets (300 mg total) by mouth daily., Disp: 270 tablet, Rfl: 1   No Known Allergies  Past Medical History:  Diagnosis Date   Anxiety    Depression    History of chicken pox    OCD (obsessive compulsive disorder)      History reviewed. No pertinent surgical history.  Family History  Problem Relation Age of Onset   Alcohol abuse Father    Cancer Father    Depression Father    Mental illness Father    Throat cancer Father    Mental illness Brother    Arthritis Maternal Grandmother    Depression Maternal Grandmother    Diabetes Maternal Grandmother    Hearing loss Maternal Grandmother    Hypertension Maternal Grandmother    Alcohol abuse Maternal Grandfather    Cancer Maternal Grandfather    Early death Paternal Grandmother    Alcohol abuse Paternal Grandfather    Cancer Paternal Grandfather     Social History   Tobacco Use   Smoking status: Never   Smokeless tobacco: Never  Vaping Use   Vaping Use: Never used  Substance Use Topics   Alcohol use: Never   Drug use: Never    ROS   Objective:   Vitals: BP 105/67 (BP Location: Right Arm)   Pulse 73   Temp 98.3 F (36.8 C) (Oral)   Resp 16   SpO2 96%   Physical Exam Constitutional:      General: She is not in acute  distress.    Appearance: Normal appearance. She is well-developed and normal weight. She is not ill-appearing, toxic-appearing or diaphoretic.  HENT:     Head: Normocephalic and atraumatic.     Right Ear: Tympanic membrane, ear canal and external ear normal. No drainage or tenderness. No middle ear effusion. There is no impacted cerumen. Tympanic membrane is not erythematous or bulging.     Left Ear: Tympanic membrane, ear canal and external ear normal. No drainage or tenderness.  No middle ear effusion. There is no impacted cerumen. Tympanic membrane is not erythematous or bulging.     Nose: Nose normal. No congestion or rhinorrhea.     Mouth/Throat:     Mouth: Mucous membranes are moist. No oral lesions.     Pharynx: No pharyngeal swelling, oropharyngeal exudate, posterior oropharyngeal erythema or uvula swelling.     Tonsils: No tonsillar exudate or tonsillar abscesses. 0 on the right. 0 on the left.  Eyes:     General: No scleral icterus.       Right eye: No discharge.        Left eye: No discharge.     Extraocular Movements: Extraocular movements intact.     Right eye: Normal extraocular motion.     Left eye: Normal extraocular motion.     Conjunctiva/sclera: Conjunctivae normal.  Cardiovascular:     Rate and Rhythm: Normal rate.  Pulmonary:     Effort: Pulmonary effort is normal.  Musculoskeletal:     Cervical back: Normal range of motion and neck supple.  Lymphadenopathy:     Cervical: No cervical adenopathy.  Skin:    General: Skin is warm and dry.  Neurological:     General: No focal deficit present.     Mental Status: She is alert and oriented to person, place, and time.  Psychiatric:        Mood and Affect: Mood normal.        Behavior: Behavior normal.    Results for orders placed or performed during the hospital encounter of 03/19/22 (from the past 24 hour(s))  POCT rapid strep A     Status: None   Collection Time: 03/19/22  1:09 PM  Result Value Ref Range    Rapid Strep A Screen Negative Negative   Assessment and Plan :   PDMP not reviewed this encounter.  1. Viral upper respiratory infection   2. Throat pain     Will manage for viral illness such as viral URI, viral syndrome, viral rhinitis, COVID-19, viral pharyngitis. Recommended supportive care. Offered scripts for symptomatic relief. COVID 19 and strep culture are pending. Counseled patient on potential for adverse effects with medications prescribed/recommended today, ER and return-to-clinic precautions discussed, patient verbalized understanding.     Jaynee Eagles, PA-C 03/19/22 1323

## 2022-03-19 NOTE — ED Triage Notes (Signed)
Pt c/o sore throat x 2 days-NAD-steady gait

## 2022-03-19 NOTE — Discharge Instructions (Signed)
We will notify you of your test results as they arrive and may take between about 24 hours.  I encourage you to sign up for MyChart if you have not already done so as this can be the easiest way for [place] to communicate results to you online or through a phone app.  Generally, we only contact you if it is a positive test result.  In the meantime, if you develop worsening symptoms including fever, chest pain, shortness of breath despite our current treatment plan then please report to the emergency room as this may be a sign of worsening status from possible viral infection.  Otherwise, we will manage this as a viral syndrome. For sore throat or cough try using a honey-based tea. Use 3 teaspoons of honey with juice squeezed from half lemon. Place shaved pieces of ginger into 1/2-1 cup of water and warm over stove top. Then mix the ingredients and repeat every 4 hours as needed. Please take ibuprofen 631m every 6 hours for aches and pains, fevers. Hydrate very well with at least 2 liters of water. Eat light meals such as soups to replenish electrolytes and soft fruits, veggies. Start an antihistamine like Zyrtec (148mdaily) for postnasal drainage, sinus congestion.  You can take this together with pseudoephedrine (Sudafed) at a dose of 60 mg 2-3 times a day as needed for the same kind of congestion.

## 2022-03-20 LAB — SARS CORONAVIRUS 2 (TAT 6-24 HRS): SARS Coronavirus 2: NEGATIVE

## 2022-03-22 LAB — CULTURE, GROUP A STREP (THRC)

## 2022-04-23 ENCOUNTER — Ambulatory Visit: Payer: 59

## 2022-04-28 ENCOUNTER — Ambulatory Visit: Payer: 59

## 2022-05-04 DIAGNOSIS — Z20822 Contact with and (suspected) exposure to covid-19: Secondary | ICD-10-CM | POA: Diagnosis not present

## 2022-05-04 DIAGNOSIS — Z03818 Encounter for observation for suspected exposure to other biological agents ruled out: Secondary | ICD-10-CM | POA: Diagnosis not present

## 2022-05-04 DIAGNOSIS — H66003 Acute suppurative otitis media without spontaneous rupture of ear drum, bilateral: Secondary | ICD-10-CM | POA: Diagnosis not present

## 2022-05-04 DIAGNOSIS — J029 Acute pharyngitis, unspecified: Secondary | ICD-10-CM | POA: Diagnosis not present

## 2022-05-04 DIAGNOSIS — Z6826 Body mass index (BMI) 26.0-26.9, adult: Secondary | ICD-10-CM | POA: Diagnosis not present

## 2022-05-04 DIAGNOSIS — J309 Allergic rhinitis, unspecified: Secondary | ICD-10-CM | POA: Diagnosis not present

## 2022-05-06 DIAGNOSIS — J020 Streptococcal pharyngitis: Secondary | ICD-10-CM | POA: Diagnosis not present

## 2022-05-06 DIAGNOSIS — J029 Acute pharyngitis, unspecified: Secondary | ICD-10-CM | POA: Diagnosis not present

## 2022-05-08 DIAGNOSIS — Z6826 Body mass index (BMI) 26.0-26.9, adult: Secondary | ICD-10-CM | POA: Diagnosis not present

## 2022-05-08 DIAGNOSIS — Z113 Encounter for screening for infections with a predominantly sexual mode of transmission: Secondary | ICD-10-CM | POA: Diagnosis not present

## 2022-05-08 DIAGNOSIS — Z202 Contact with and (suspected) exposure to infections with a predominantly sexual mode of transmission: Secondary | ICD-10-CM | POA: Diagnosis not present

## 2022-05-08 DIAGNOSIS — Z7253 High risk bisexual behavior: Secondary | ICD-10-CM | POA: Diagnosis not present

## 2022-05-21 DIAGNOSIS — J029 Acute pharyngitis, unspecified: Secondary | ICD-10-CM | POA: Diagnosis not present

## 2022-05-23 ENCOUNTER — Ambulatory Visit
Admission: EM | Admit: 2022-05-23 | Discharge: 2022-05-23 | Disposition: A | Discharge status: Home / Self Care | Payer: 59 | Attending: Urgent Care | Admitting: Urgent Care

## 2022-05-23 DIAGNOSIS — H6993 Unspecified Eustachian tube disorder, bilateral: Secondary | ICD-10-CM | POA: Insufficient documentation

## 2022-05-23 DIAGNOSIS — K1379 Other lesions of oral mucosa: Secondary | ICD-10-CM | POA: Insufficient documentation

## 2022-05-23 DIAGNOSIS — R070 Pain in throat: Secondary | ICD-10-CM | POA: Diagnosis not present

## 2022-05-23 LAB — POCT RAPID STREP A (OFFICE): Rapid Strep A Screen: NEGATIVE

## 2022-05-23 MED ORDER — CETIRIZINE HCL 10 MG PO TABS: 10.0000 mg | ORAL_TABLET | Freq: Every day | ORAL | 0 refills | Status: DC

## 2022-05-23 MED ORDER — PREDNISONE 20 MG PO TABS: ORAL_TABLET | ORAL | 0 refills | Status: DC

## 2022-05-23 NOTE — ED Triage Notes (Signed)
Pt c/o sore throat, right earache, right side neck pain, sores in mouth-sx started 4/23-seen at [facility] 4/24 with neg strep-pt states she had strep throat ~2+ weeks ago-completed abx-NAD-steady gait

## 2022-05-23 NOTE — ED Provider Notes (Signed)
[facility]  Note:  This document was prepared using Conservation officer, historic buildings and may include unintentional dictation errors.  MRN: [id number] DOB: 10-06-1990  Subjective:   Gina Gillespie is a 32 y.o. female presenting for 3 day history of acute onset recurrent throat pain, painful swallowing, redness on the roof of her mouth/sore.  She did have STI testing recently was negative but did not get swabbed in the throat.  She has also had right ear pain, sinus fullness, right-sided neck pain.  Also, 2 weeks ago she did get treated for strep infection with Augmentin.  She had taken 2 days of amoxicillin before then but if she did not improve, was seen again and switch to Augmentin.  She completed that course entirely.  No rashes.  No cough, chest pain, shortness of breath or wheezing.  No smoking of any kind including cigarettes, cigars, vaping, marijuana use.    No current facility-administered medications for this encounter.  Current Outpatient Medications:    ALPRAZolam (XANAX) 0.5 MG tablet, Take 1 tablet (0.5 mg total) by mouth 3 (three) times daily as needed for anxiety., Disp: 15 tablet, Rfl: 0   cetirizine (ZYRTEC ALLERGY) 10 MG tablet, Take 1 tablet (10 mg total) by mouth daily., Disp: 30 tablet, Rfl: 0   ibuprofen (ADVIL) 600 MG tablet, Take 1 tablet (600 mg total) by mouth every 6 (six) hours as needed., Disp: 30 tablet, Rfl: 0   pseudoephedrine (SUDAFED) 60 MG tablet, Take 1 tablet (60 mg total) by mouth every 8 (eight) hours as needed for congestion., Disp: 30 tablet, Rfl: 0   sertraline (ZOLOFT) 100 MG tablet, Take 3 tablets (300 mg total) by mouth daily., Disp: 270 tablet, Rfl: 1   No Known Allergies  Past Medical History:  Diagnosis Date   Anxiety    Depression    History of chicken pox    OCD (obsessive compulsive disorder)      History reviewed. No pertinent surgical history.  Family History  Problem Relation Age of Onset   Alcohol  abuse Father    Cancer Father    Depression Father    Mental illness Father    Throat cancer Father    Mental illness Brother    Arthritis Maternal Grandmother    Depression Maternal Grandmother    Diabetes Maternal Grandmother    Hearing loss Maternal Grandmother    Hypertension Maternal Grandmother    Alcohol abuse Maternal Grandfather    Cancer Maternal Grandfather    Early death Paternal Grandmother    Alcohol abuse Paternal Grandfather    Cancer Paternal Grandfather     Social History   Tobacco Use   Smoking status: Never   Smokeless tobacco: Never  Vaping Use   Vaping Use: Never used  Substance Use Topics   Alcohol use: Never   Drug use: Never    ROS   Objective:   Vitals: BP 120/71 (BP Location: Left Arm)   Pulse 72   Temp 98.8 F (37.1 C) (Oral)   Resp 20   SpO2 97%   Physical Exam Constitutional:      General: She is not in acute distress.    Appearance: Normal appearance. She is well-developed and normal weight. She is not ill-appearing, toxic-appearing or diaphoretic.  HENT:     Head: Normocephalic and atraumatic.     Right Ear: Ear canal and external ear normal. No drainage, swelling or tenderness. A middle ear effusion is present. There is no  impacted cerumen. Tympanic membrane is not erythematous or bulging.     Left Ear: Ear canal and external ear normal. No drainage, swelling or tenderness. A middle ear effusion is present. There is no impacted cerumen. Tympanic membrane is not erythematous or bulging.     Nose: No congestion or rhinorrhea.     Mouth/Throat:     Mouth: Mucous membranes are moist. No oral lesions.     Pharynx: Posterior oropharyngeal erythema present. No pharyngeal swelling, oropharyngeal exudate or uvula swelling.     Tonsils: No tonsillar exudate or tonsillar abscesses.   Eyes:     General: No scleral icterus.       Right eye: No discharge.        Left eye: No discharge.     Extraocular Movements: Extraocular movements  intact.     Right eye: Normal extraocular motion.     Left eye: Normal extraocular motion.     Conjunctiva/sclera: Conjunctivae normal.  Cardiovascular:     Rate and Rhythm: Normal rate.  Pulmonary:     Effort: Pulmonary effort is normal.  Musculoskeletal:     Cervical back: Normal range of motion and neck supple.  Lymphadenopathy:     Cervical: No cervical adenopathy.  Skin:    General: Skin is warm and dry.  Neurological:     General: No focal deficit present.     Mental Status: She is alert and oriented to person, place, and time.  Psychiatric:        Mood and Affect: Mood normal.        Behavior: Behavior normal.     Results for orders placed or performed during the hospital encounter of 05/23/22 (from the past 24 hour(s))  POCT rapid strep A     Status: None   Collection Time: 05/23/22  2:30 PM  Result Value Ref Range   Rapid Strep A Screen Negative Negative    Assessment and Plan :   PDMP not reviewed this encounter.  1. Eustachian tube dysfunction, bilateral   2. Throat pain   3. Oral pain    We discussed the possibility of using another round of antibiotics and for now, patient declined.  Strep culture pending.  Oral cytology pending.  She would like aggressive management for her sinuses and ears, inflammation in her throat and therefore I offered an oral prednisone course.  Couple this with Zyrtec, Tylenol, fluids and rest.  Counseled patient on potential for adverse effects with medications prescribed/recommended today, ER and return-to-clinic precautions discussed, patient verbalized understanding.    Wallis Bamberg, [place] 05/23/22 1448

## 2022-05-23 NOTE — Discharge Instructions (Signed)
Let's use an oral prednisone course to address inflammation in your throat, ear tubes, sinuses. You can take this with Zyrtec. Get plenty of fluids and rest. You can use Tylenol still for aches, pains and fevers. We will let you know what the rest of your results are on Monday.

## 2022-05-26 LAB — CYTOLOGY, (ORAL, ANAL, URETHRAL) ANCILLARY ONLY
Chlamydia: NEGATIVE
Comment: NEGATIVE
Comment: NEGATIVE
Comment: NORMAL
Neisseria Gonorrhea: NEGATIVE
Trichomonas: NEGATIVE

## 2022-05-27 LAB — CULTURE, GROUP A STREP (THRC)

## 2022-06-04 ENCOUNTER — Ambulatory Visit
Admission: EM | Admit: 2022-06-04 | Discharge: 2022-06-04 | Disposition: A | Discharge status: Home / Self Care | Payer: 59 | Attending: Nurse Practitioner | Admitting: Nurse Practitioner

## 2022-06-04 ENCOUNTER — Other Ambulatory Visit: Payer: Self-pay

## 2022-06-04 DIAGNOSIS — J020 Streptococcal pharyngitis: Secondary | ICD-10-CM

## 2022-06-04 DIAGNOSIS — J029 Acute pharyngitis, unspecified: Secondary | ICD-10-CM | POA: Diagnosis not present

## 2022-06-04 LAB — POCT RAPID STREP A (OFFICE): Rapid Strep A Screen: POSITIVE — AB

## 2022-06-04 MED ORDER — AMOXICILLIN-POT CLAVULANATE 875-125 MG PO TABS: 1.0000 | ORAL_TABLET | Freq: Two times a day (BID) | ORAL | 0 refills | Status: AC

## 2022-06-04 NOTE — ED Triage Notes (Signed)
Pt denies fever. 

## 2022-06-04 NOTE — ED Provider Notes (Signed)
[facility]    CSN: [id number] Arrival date & time: 06/04/22  0906      History   Chief Complaint Chief Complaint  Patient presents with   Sore Throat    Entered by patient    HPI Gina Gillespie is a 32 y.o. female  presents for evaluation of URI symptoms for 1 days. Patient reports associated symptoms of sore throat, congestion, cough, ear pain. Denies N/V/D, fevers, body aches, shortness of breath. Patient does not have a hx of asthma or smoking. No known sick contacts.  Pt has taken ibuprofen OTC for symptoms. Pt has no other concerns at this time.    Sore Throat    Past Medical History:  Diagnosis Date   Anxiety    Depression    History of chicken pox    OCD (obsessive compulsive disorder)     Patient Active Problem List   Diagnosis Date Noted   Stressful life event affecting family 08/27/2018   Supervision of low-risk pregnancy 08/11/2018   Severe recurrent major depression without psychotic features (HCC) 07/05/2018   GAD (generalized anxiety disorder) 03/22/2018   Bilateral leg pain 08/09/2017    History reviewed. No pertinent surgical history.  OB History     Gravida  3   Para  2   Term  2   Preterm      AB      Living  2      SAB      IAB      Ectopic      Multiple      Live Births  2            Home Medications    Prior to Admission medications   Medication Sig Start Date End Date Taking? Authorizing Provider  amoxicillin-clavulanate (AUGMENTIN) 875-125 MG tablet Take 1 tablet by mouth every 12 (twelve) hours for 10 days. 06/04/22 06/14/22 Yes Radford Pax, NP  ALPRAZolam Prudy Feeler) 0.5 MG tablet Take 1 tablet (0.5 mg total) by mouth 3 (three) times daily as needed for anxiety. 11/18/21   Cottle, Steva Ready., MD  cetirizine (ZYRTEC ALLERGY) 10 MG tablet Take 1 tablet (10 mg total) by mouth daily. 05/23/22   Wallis Bamberg, PA-C  ibuprofen (ADVIL) 600 MG tablet Take 1 tablet (600 mg total) by mouth every 6 (six) hours as  needed. 03/19/22   Wallis Bamberg, PA-C  predniSONE (DELTASONE) 20 MG tablet Take 2 tablets daily with breakfast. 05/23/22   Wallis Bamberg, PA-C  pseudoephedrine (SUDAFED) 60 MG tablet Take 1 tablet (60 mg total) by mouth every 8 (eight) hours as needed for congestion. 03/19/22   Wallis Bamberg, PA-C  sertraline (ZOLOFT) 100 MG tablet Take 3 tablets (300 mg total) by mouth daily. 11/18/21   Cottle, Steva Ready., MD    Family History Family History  Problem Relation Age of Onset   Alcohol abuse Father    Cancer Father    Depression Father    Mental illness Father    Throat cancer Father    Mental illness Brother    Arthritis Maternal Grandmother    Depression Maternal Grandmother    Diabetes Maternal Grandmother    Hearing loss Maternal Grandmother    Hypertension Maternal Grandmother    Alcohol abuse Maternal Grandfather    Cancer Maternal Grandfather    Early death Paternal Grandmother    Alcohol abuse Paternal Grandfather    Cancer Paternal Grandfather     Social History Social History  Tobacco Use   Smoking status: Never   Smokeless tobacco: Never  Vaping Use   Vaping Use: Never used  Substance Use Topics   Alcohol use: Never   Drug use: Never     Allergies   Patient has no known allergies.   Review of Systems Review of Systems  HENT:  Positive for congestion and sore throat.   Respiratory:  Positive for cough.      Physical Exam Triage Vital Signs ED Triage Vitals  Enc Vitals Group     BP 06/04/22 0921 105/68     Pulse Rate 06/04/22 0921 78     Resp 06/04/22 0921 18     Temp 06/04/22 0921 98.7 F (37.1 C)     Temp Source 06/04/22 0921 Oral     SpO2 06/04/22 0921 95 %     Weight --      Height --      Head Circumference --      Peak Flow --      Pain Score 06/04/22 0920 5     Pain Loc --      Pain Edu? --      Excl. in GC? --    No data found.  Updated Vital Signs BP 105/68   Pulse 78   Temp 98.7 F (37.1 C) (Oral)   Resp 18   SpO2 95%    Visual Acuity Right Eye Distance:   Left Eye Distance:   Bilateral Distance:    Right Eye Near:   Left Eye Near:    Bilateral Near:     Physical Exam Vitals and nursing note reviewed.  Constitutional:      General: She is not in acute distress.    Appearance: She is well-developed. She is not ill-appearing.  HENT:     Head: Normocephalic and atraumatic.     Right Ear: Tympanic membrane and ear canal normal.     Left Ear: Tympanic membrane and ear canal normal.     Nose: Congestion present.     Mouth/Throat:     Mouth: Mucous membranes are moist.     Pharynx: Oropharynx is clear. Uvula midline. Posterior oropharyngeal erythema present.     Tonsils: No tonsillar exudate or tonsillar abscesses.  Eyes:     Conjunctiva/sclera: Conjunctivae normal.     Pupils: Pupils are equal, round, and reactive to light.  Cardiovascular:     Rate and Rhythm: Normal rate and regular rhythm.     Heart sounds: Normal heart sounds.  Pulmonary:     Effort: Pulmonary effort is normal.     Breath sounds: Normal breath sounds.  Musculoskeletal:     Cervical back: Normal range of motion and neck supple.  Lymphadenopathy:     Cervical: No cervical adenopathy.  Skin:    General: Skin is warm and dry.  Neurological:     General: No focal deficit present.     Mental Status: She is alert and oriented to person, place, and time.  Psychiatric:        Mood and Affect: Mood normal.        Behavior: Behavior normal.      UC Treatments / Results  Labs (all labs ordered are listed, but only abnormal results are displayed) Labs Reviewed  POCT RAPID STREP A (OFFICE) - Abnormal; Notable for the following components:      Result Value   Rapid Strep A Screen Positive (*)    All other components within normal limits  Culture, group A strep   Specimen Information: Throat  1 Result Note    Component 12 d ago  Specimen Description THROAT  Special Requests NONE  Culture ABUNDANT  STREPTOCOCCUS,BETA HEMOLYTIC NOT GROUP A Beta hemolytic streptococci are predictably susceptible to penicillin and other beta lactams. Susceptibility testing not routinely performed. Performed at [facility], [address], [place], [place] [address]  Report Status 05/27/2022 FINAL  Resulting Agency [facility]         Specimen Collected: 05/23/22 14:34 Last Resulted: 05/27/22 14:27           EKG   Radiology No results found.  Procedures Procedures (including critical care time)  Medications Ordered in UC Medications - No data to display  Initial Impression / Assessment and Plan / UC Course  I have reviewed the triage vital signs and the nursing notes.  Pertinent labs & imaging results that were available during my care of the patient were reviewed by me and considered in my medical decision making (see chart for details).    Reviewed recent labs and progress notes  Positive rapid strep.  Start Augmentin.  Patient states she has had treatment failure on Amoxil before OTC analgesics, salt water gargles and warm liquids PCP follow-up if symptoms do not improve ER precautions reviewed and patient verbalized understanding Final Clinical Impressions(s) / UC Diagnoses   Final diagnoses:  Sore throat  Streptococcal sore throat     Discharge Instructions      Start Augmentin twice daily for 10 days Salt water gargles and warm liquids such as tea and honey Over-the-counter Tylenol or ibuprofen as needed Follow-up with your PCP if your symptoms do not improve Please go to emergency room if you develop any worsening symptoms     ED Prescriptions     Medication Sig Dispense Auth. Provider   amoxicillin-clavulanate (AUGMENTIN) 875-125 MG tablet Take 1 tablet by mouth every 12 (twelve) hours for 10 days. 20 tablet Radford Pax, NP      PDMP not reviewed this encounter.   Radford Pax, NP 06/04/22 1021

## 2022-06-04 NOTE — ED Triage Notes (Signed)
Pt presents with c/o sore throat x 2 days and possible sinus infection.   Home interventions: ibuprofen

## 2022-06-04 NOTE — Discharge Instructions (Signed)
Start Augmentin twice daily for 10 days Salt water gargles and warm liquids such as tea and honey Over-the-counter Tylenol or ibuprofen as needed Follow-up with your PCP if your symptoms do not improve Please go to emergency room if you develop any worsening symptoms

## 2022-07-01 ENCOUNTER — Ambulatory Visit
Admission: EM | Admit: 2022-07-01 | Discharge: 2022-07-01 | Disposition: A | Discharge status: Home / Self Care | Payer: 59 | Attending: Urgent Care | Admitting: Urgent Care

## 2022-07-01 DIAGNOSIS — R070 Pain in throat: Secondary | ICD-10-CM | POA: Insufficient documentation

## 2022-07-01 DIAGNOSIS — J029 Acute pharyngitis, unspecified: Secondary | ICD-10-CM | POA: Insufficient documentation

## 2022-07-01 LAB — POCT RAPID STREP A (OFFICE): Rapid Strep A Screen: NEGATIVE

## 2022-07-01 MED ORDER — IBUPROFEN 600 MG PO TABS: 600.0000 mg | ORAL_TABLET | Freq: Four times a day (QID) | ORAL | 0 refills | Status: DC | PRN

## 2022-07-01 NOTE — ED Triage Notes (Signed)
Pt report sore throat x 2 days. Pt has not taken nay meds for complaint.   Pt requested Strep test.

## 2022-07-01 NOTE — ED Provider Notes (Signed)
[facility]  Note:  This document was prepared using Conservation officer, historic buildings and may include unintentional dictation errors.  MRN: [id number] DOB: 1991-01-17  Subjective:   Gina Gillespie is a 32 y.o. female presenting for 2-day history of throat pain, painful swallowing, pain radiating to her right ear.  Would like a strep test.  For the past year she has had several strep infections.  She is consistently monitored on amoxicillin, amoxicillin/clavulanate.  The last episode was 06/04/2022.  No other symptoms including runny or stuffy nose, cough.  No current facility-administered medications for this encounter.  Current Outpatient Medications:    ALPRAZolam (XANAX) 0.5 MG tablet, Take 1 tablet (0.5 mg total) by mouth 3 (three) times daily as needed for anxiety., Disp: 15 tablet, Rfl: 0   cetirizine (ZYRTEC ALLERGY) 10 MG tablet, Take 1 tablet (10 mg total) by mouth daily., Disp: 30 tablet, Rfl: 0   ibuprofen (ADVIL) 600 MG tablet, Take 1 tablet (600 mg total) by mouth every 6 (six) hours as needed., Disp: 30 tablet, Rfl: 0   predniSONE (DELTASONE) 20 MG tablet, Take 2 tablets daily with breakfast., Disp: 10 tablet, Rfl: 0   pseudoephedrine (SUDAFED) 60 MG tablet, Take 1 tablet (60 mg total) by mouth every 8 (eight) hours as needed for congestion., Disp: 30 tablet, Rfl: 0   sertraline (ZOLOFT) 100 MG tablet, Take 3 tablets (300 mg total) by mouth daily., Disp: 270 tablet, Rfl: 1   No Known Allergies  Past Medical History:  Diagnosis Date   Anxiety    Depression    History of chicken pox    OCD (obsessive compulsive disorder)      History reviewed. No pertinent surgical history.  Family History  Problem Relation Age of Onset   Alcohol abuse Father    Cancer Father    Depression Father    Mental illness Father    Throat cancer Father    Mental illness Brother    Arthritis Maternal Grandmother    Depression Maternal Grandmother    Diabetes  Maternal Grandmother    Hearing loss Maternal Grandmother    Hypertension Maternal Grandmother    Alcohol abuse Maternal Grandfather    Cancer Maternal Grandfather    Early death Paternal Grandmother    Alcohol abuse Paternal Grandfather    Cancer Paternal Grandfather     Social History   Tobacco Use   Smoking status: Never   Smokeless tobacco: Never  Vaping Use   Vaping Use: Never used  Substance Use Topics   Alcohol use: Not Currently   Drug use: Never    ROS   Objective:   Vitals: BP 110/71 (BP Location: Right Arm)   Pulse 82   Temp 98.4 F (36.9 C) (Oral)   Resp 16   SpO2 97%   Physical Exam Constitutional:      General: She is not in acute distress.    Appearance: Normal appearance. She is well-developed. She is not ill-appearing, toxic-appearing or diaphoretic.  HENT:     Head: Normocephalic and atraumatic.     Nose: Nose normal.     Mouth/Throat:     Mouth: Mucous membranes are moist.     Pharynx: Posterior oropharyngeal erythema present. No pharyngeal swelling, oropharyngeal exudate or uvula swelling.     Tonsils: No tonsillar exudate or tonsillar abscesses. 0 on the right. 0 on the left.  Eyes:     General: No scleral icterus.       Right  eye: No discharge.        Left eye: No discharge.     Extraocular Movements: Extraocular movements intact.  Cardiovascular:     Rate and Rhythm: Normal rate.  Pulmonary:     Effort: Pulmonary effort is normal.  Skin:    General: Skin is warm and dry.  Neurological:     General: No focal deficit present.     Mental Status: She is alert and oriented to person, place, and time.  Psychiatric:        Mood and Affect: Mood normal.        Behavior: Behavior normal.     Results for orders placed or performed during the hospital encounter of 07/01/22 (from the past 24 hour(s))  POCT rapid strep A     Status: None   Collection Time: 07/01/22  9:33 AM  Result Value Ref Range   Rapid Strep A Screen Negative Negative     Assessment and Plan :   PDMP not reviewed this encounter.  1. Acute pharyngitis, unspecified etiology   2. Throat pain    Recommend supportive care for now, discussed antibiotic stewardship and patient is very agreeable to this.  I do believe it would be better for patient to undergo a different class of antibiotic as she has had regular strep infections over the past year.  Clindamycin would be a reasonable alternative.  Discussed this with patient and she would like to trial this should she test positive for strep again through the culture.  Refilled her ibuprofen.  Counseled patient on potential for adverse effects with medications prescribed/recommended today, ER and return-to-clinic precautions discussed, patient verbalized understanding.    Wallis Bamberg, [place] 07/01/22 [phone]

## 2022-07-01 NOTE — Discharge Instructions (Addendum)
Will update you with your strep culture result when it comes back.  If he test positive for strep again through the strep culture, it might be better to try a different antibiotic class than the amoxicillin if consistently tried for your previous strep infections.  Other alternative would be clindamycin.  In the meantime, you can use ibuprofen for pain and inflammation.  Please feel free to come back if your strep culture is still pending and you continue to have symptoms or worsen.

## 2022-07-02 LAB — CULTURE, GROUP A STREP (THRC)

## 2022-07-04 ENCOUNTER — Other Ambulatory Visit: Payer: Self-pay | Admitting: Urgent Care

## 2022-07-04 LAB — CULTURE, GROUP A STREP (THRC)

## 2022-07-04 MED ORDER — CEFDINIR 300 MG PO CAPS: 300.0000 mg | ORAL_CAPSULE | Freq: Two times a day (BID) | ORAL | 0 refills | Status: DC

## 2022-07-12 DIAGNOSIS — Z8709 Personal history of other diseases of the respiratory system: Secondary | ICD-10-CM | POA: Diagnosis not present

## 2022-07-12 DIAGNOSIS — R197 Diarrhea, unspecified: Secondary | ICD-10-CM | POA: Diagnosis not present

## 2022-07-12 DIAGNOSIS — R109 Unspecified abdominal pain: Secondary | ICD-10-CM | POA: Diagnosis not present

## 2022-07-14 ENCOUNTER — Ambulatory Visit
Admission: EM | Admit: 2022-07-14 | Discharge: 2022-07-14 | Disposition: A | Discharge status: Home / Self Care | Payer: 59 | Attending: Urgent Care | Admitting: Urgent Care

## 2022-07-14 DIAGNOSIS — R197 Diarrhea, unspecified: Secondary | ICD-10-CM | POA: Diagnosis not present

## 2022-07-14 DIAGNOSIS — K529 Noninfective gastroenteritis and colitis, unspecified: Secondary | ICD-10-CM

## 2022-07-14 DIAGNOSIS — R109 Unspecified abdominal pain: Secondary | ICD-10-CM

## 2022-07-14 MED ORDER — DICYCLOMINE HCL 20 MG PO TABS: 20.0000 mg | ORAL_TABLET | Freq: Two times a day (BID) | ORAL | 0 refills | Status: DC

## 2022-07-14 MED ORDER — ONDANSETRON 8 MG PO TBDP: 8.0000 mg | ORAL_TABLET | Freq: Three times a day (TID) | ORAL | 0 refills | Status: DC | PRN

## 2022-07-14 NOTE — Discharge Instructions (Addendum)
Please provide a stool sample and return it to a [facility] facility for processing. Make sure you push fluids drinking mostly water but mix it with Gatorade.  Try to eat light meals including soups, broths and soft foods, fruits.  You may use Zofran and Bentyl (instead of loperamide/Imodium) for your diarrhea, loose stools, abdominal cramping.  Please return to the clinic if symptoms worsen or you start having severe abdominal pain not helped by taking Tylenol or start having bloody stools or blood in the vomit.

## 2022-07-14 NOTE — ED Notes (Signed)
Pt given stool collection supplies at d/c

## 2022-07-14 NOTE — ED Triage Notes (Signed)
Pt presents with c/o diarrhea, stomach pain x 1 week and is concerned of it being a reaction to cefdinir.

## 2022-07-14 NOTE — ED Provider Notes (Signed)
[facility]  Note:  This document was prepared using Conservation officer, historic buildings and may include unintentional dictation errors.  MRN: [id number] DOB: [birth year]  Subjective:   Gina Gillespie is a 32 y.o. female presenting for 1 week history of persistent diarrhea, abdominal cramping and pains.  Patient has been undergoing treatment for recurrent strep pharyngitis.  She was taking cefdinir.  Unfortunately due to her GI side effects, she stopped taking the medication.  She went to a different [facility] clinic and was subsequently prescribed azithromycin.  No bloody stools.  No history of GI disorders.  No current facility-administered medications for this encounter.  Current Outpatient Medications:    ALPRAZolam (XANAX) 0.5 MG tablet, Take 1 tablet (0.5 mg total) by mouth 3 (three) times daily as needed for anxiety., Disp: 15 tablet, Rfl: 0   cefdinir (OMNICEF) 300 MG capsule, Take 1 capsule (300 mg total) by mouth 2 (two) times daily., Disp: 20 capsule, Rfl: 0   cetirizine (ZYRTEC ALLERGY) 10 MG tablet, Take 1 tablet (10 mg total) by mouth daily., Disp: 30 tablet, Rfl: 0   ibuprofen (ADVIL) 600 MG tablet, Take 1 tablet (600 mg total) by mouth every 6 (six) hours as needed., Disp: 30 tablet, Rfl: 0   predniSONE (DELTASONE) 20 MG tablet, Take 2 tablets daily with breakfast., Disp: 10 tablet, Rfl: 0   pseudoephedrine (SUDAFED) 60 MG tablet, Take 1 tablet (60 mg total) by mouth every 8 (eight) hours as needed for congestion., Disp: 30 tablet, Rfl: 0   sertraline (ZOLOFT) 100 MG tablet, Take 3 tablets (300 mg total) by mouth daily., Disp: 270 tablet, Rfl: 1   No Known Allergies  Past Medical History:  Diagnosis Date   Anxiety    Depression    History of chicken pox    OCD (obsessive compulsive disorder)      History reviewed. No pertinent surgical history.  Family History  Problem Relation Age of Onset   Alcohol abuse Father    Cancer Father     Depression Father    Mental illness Father    Throat cancer Father    Mental illness Brother    Arthritis Maternal Grandmother    Depression Maternal Grandmother    Diabetes Maternal Grandmother    Hearing loss Maternal Grandmother    Hypertension Maternal Grandmother    Alcohol abuse Maternal Grandfather    Cancer Maternal Grandfather    Early death Paternal Grandmother    Alcohol abuse Paternal Grandfather    Cancer Paternal Grandfather     Social History   Tobacco Use   Smoking status: Never   Smokeless tobacco: Never  Vaping Use   Vaping Use: Never used  Substance Use Topics   Alcohol use: Not Currently   Drug use: Never    ROS   Objective:   Vitals: BP 108/69   Pulse 66   Temp 98.1 F (36.7 C) (Oral)   Resp 17   SpO2 96%   Physical Exam Constitutional:      General: She is not in acute distress.    Appearance: Normal appearance. She is well-developed. She is not ill-appearing, toxic-appearing or diaphoretic.  HENT:     Head: Normocephalic and atraumatic.     Nose: Nose normal.     Mouth/Throat:     Mouth: Mucous membranes are moist.  Eyes:     General: No scleral icterus.       Right eye: No discharge.  Left eye: No discharge.     Extraocular Movements: Extraocular movements intact.  Cardiovascular:     Rate and Rhythm: Normal rate.  Pulmonary:     Effort: Pulmonary effort is normal.  Skin:    General: Skin is warm and dry.  Neurological:     General: No focal deficit present.     Mental Status: She is alert and oriented to person, place, and time.  Psychiatric:        Mood and Affect: Mood normal.        Behavior: Behavior normal.     Assessment and Plan :   PDMP not reviewed this encounter.  1. Colitis   2. Diarrhea, unspecified type   3. Abdominal cramps    Patient can finish azithromycin to continue to try and address her recurrent strep pharyngitis as she was not able to complete the cefdinir course.  I did recommend  testing for C. difficile as she has taken multiple rounds of antibiotics this year for strep.  Use supportive care otherwise for GI side effects.  Counseled patient on potential for adverse effects with medications prescribed/recommended today, ER and return-to-clinic precautions discussed, patient verbalized understanding.    Wallis Bamberg, [place] 07/14/22 1559

## 2022-08-06 DIAGNOSIS — L03031 Cellulitis of right toe: Secondary | ICD-10-CM | POA: Diagnosis not present

## 2022-08-27 DIAGNOSIS — Z708 Other sex counseling: Secondary | ICD-10-CM | POA: Diagnosis not present

## 2022-08-27 DIAGNOSIS — Z681 Body mass index (BMI) 19 or less, adult: Secondary | ICD-10-CM | POA: Diagnosis not present

## 2022-09-01 ENCOUNTER — Ambulatory Visit
Admission: EM | Admit: 2022-09-01 | Discharge: 2022-09-01 | Disposition: A | Discharge status: Home / Self Care | Payer: 59

## 2022-09-01 DIAGNOSIS — L03116 Cellulitis of left lower limb: Secondary | ICD-10-CM | POA: Diagnosis not present

## 2022-09-01 MED ORDER — CEPHALEXIN 500 MG PO CAPS: 500.0000 mg | ORAL_CAPSULE | Freq: Four times a day (QID) | ORAL | 0 refills | Status: DC

## 2022-09-01 NOTE — ED Triage Notes (Signed)
Pt reports insect bite in the left calf x 2 days. Pt reports she has a bump in the left calf, is red, swelling, pain radiates to left leg.

## 2022-09-01 NOTE — Discharge Instructions (Signed)
Take the Keflex as prescribed to treat the superficial skin infection in your left leg.  The redness may spread for 48 hours, however then should improve.  You can use cool compresses to help with pain and swelling.  Clean area twice daily with mild soap and water and apply a Band-Aid if the area is draining.  If the area is not draining, you can leave it open to air and apply a thin layer of petroleum jelly or Aquaphor.  Seek care if symptoms worsen, specifically if you have fever, nausea/vomiting, are unable to eat or keep fluids down, please go to the ER.

## 2022-09-01 NOTE — ED Provider Notes (Signed)
[facility]    CSN: [id number] Arrival date & time: 09/01/22  1520      History   Chief Complaint Chief Complaint  Patient presents with   Insect Bite    HPI Gina Gillespie is a 32 y.o. female.   Patient presents today with swelling and pain to the left calf that began yesterday.  Reports the area started as a small pimple and she "popped it" at home.  Reports there was clear drainage coming from the area and she squeezed it this morning with more clear drainage.  Reports now, she has noticed the redness is spreading and that is very painful to touch.  It is also warm to touch.  No fevers or nausea/vomiting.  Has not taken thing for symptoms so far.  She reports has issue with "picking at my skin."  Reports has been treated for strep throat with cefdinir and amoxicillin in the past 90 days.    Past Medical History:  Diagnosis Date   Anxiety    Depression    History of chicken pox    OCD (obsessive compulsive disorder)     Patient Active Problem List   Diagnosis Date Noted   Stressful life event affecting family 08/27/2018   Supervision of low-risk pregnancy 08/11/2018   Severe recurrent major depression without psychotic features (HCC) 07/05/2018   GAD (generalized anxiety disorder) 03/22/2018   Bilateral leg pain 08/09/2017    No past surgical history on file.  OB History     Gravida  3   Para  2   Term  2   Preterm      AB      Living  2      SAB      IAB      Ectopic      Multiple      Live Births  2            Home Medications    Prior to Admission medications   Medication Sig Start Date End Date Taking? Authorizing Provider  cephALEXin (KEFLEX) 500 MG capsule Take 1 capsule (500 mg total) by mouth 4 (four) times daily for 5 days. 09/01/22 09/06/22 Yes Valentino Nose, NP  levonorgestrel (MIRENA) 20 MCG/DAY IUD 1 each by Intrauterine route once.   Yes [provider]  ALPRAZolam (XANAX) 0.5 MG tablet Take 1  tablet (0.5 mg total) by mouth 3 (three) times daily as needed for anxiety. 11/18/21   Cottle, Steva Ready., MD  cetirizine (ZYRTEC ALLERGY) 10 MG tablet Take 1 tablet (10 mg total) by mouth daily. 05/23/22   Wallis Bamberg, PA-C  dicyclomine (BENTYL) 20 MG tablet Take 1 tablet (20 mg total) by mouth 2 (two) times daily. 07/14/22   Wallis Bamberg, PA-C  ibuprofen (ADVIL) 600 MG tablet Take 1 tablet (600 mg total) by mouth every 6 (six) hours as needed. 07/01/22   Wallis Bamberg, PA-C  ondansetron (ZOFRAN-ODT) 8 MG disintegrating tablet Take 1 tablet (8 mg total) by mouth every 8 (eight) hours as needed for nausea or vomiting. 07/14/22   Wallis Bamberg, PA-C  pseudoephedrine (SUDAFED) 60 MG tablet Take 1 tablet (60 mg total) by mouth every 8 (eight) hours as needed for congestion. 03/19/22   Wallis Bamberg, PA-C  sertraline (ZOLOFT) 100 MG tablet Take 3 tablets (300 mg total) by mouth daily. 11/18/21   Cottle, Steva Ready., MD    Family History Family History  Problem Relation Age of Onset  Alcohol abuse Father    Cancer Father    Depression Father    Mental illness Father    Throat cancer Father    Mental illness Brother    Arthritis Maternal Grandmother    Depression Maternal Grandmother    Diabetes Maternal Grandmother    Hearing loss Maternal Grandmother    Hypertension Maternal Grandmother    Alcohol abuse Maternal Grandfather    Cancer Maternal Grandfather    Early death Paternal Grandmother    Alcohol abuse Paternal Grandfather    Cancer Paternal Grandfather     Social History Social History   Tobacco Use   Smoking status: Never   Smokeless tobacco: Never  Vaping Use   Vaping status: Never Used  Substance Use Topics   Alcohol use: Not Currently   Drug use: Never     Allergies   Patient has no known allergies.   Review of Systems Review of Systems Per HPI  Physical Exam Triage Vital Signs ED Triage Vitals  Encounter Vitals Group     BP 09/01/22 1525 111/74     Systolic BP  Percentile --      Diastolic BP Percentile --      Pulse Rate 09/01/22 1525 93     Resp 09/01/22 1525 16     Temp 09/01/22 1525 98.8 F (37.1 C)     Temp Source 09/01/22 1525 Oral     SpO2 09/01/22 1525 96 %     Weight --      Height --      Head Circumference --      Peak Flow --      Pain Score 09/01/22 1530 8     Pain Loc --      Pain Education --      Exclude from Growth Chart --    No data found.  Updated Vital Signs BP 111/74 (BP Location: Right Arm)   Pulse 93   Temp 98.8 F (37.1 C) (Oral)   Resp 16   SpO2 96%   Visual Acuity Right Eye Distance:   Left Eye Distance:   Bilateral Distance:    Right Eye Near:   Left Eye Near:    Bilateral Near:     Physical Exam Vitals and nursing note reviewed.  Constitutional:      General: She is not in acute distress.    Appearance: Normal appearance. She is not toxic-appearing.  HENT:     Mouth/Throat:     Mouth: Mucous membranes are moist.     Pharynx: Oropharynx is clear.  Pulmonary:     Effort: Pulmonary effort is normal. No respiratory distress.  Skin:    General: Skin is warm and dry.     Capillary Refill: Capillary refill takes less than 2 seconds.     Findings: Erythema present.     Comments: Approximately 5 cm x 5 cm circular, indurated, area of erythema to left calf in approximately area marked; there is a pinpoint circular ulcer.  Slight warmth, no fluctuance or active drainage.  Neurological:     Mental Status: She is alert and oriented to person, place, and time.  Psychiatric:        Behavior: Behavior is cooperative.      UC Treatments / Results  Labs (all labs ordered are listed, but only abnormal results are displayed) Labs Reviewed - No data to display  EKG   Radiology No results found.  Procedures Procedures (including critical care time)  Medications Ordered  in UC Medications - No data to display  Initial Impression / Assessment and Plan / UC Course  I have reviewed the triage  vital signs and the nursing notes.  Pertinent labs & imaging results that were available during my care of the patient were reviewed by me and considered in my medical decision making (see chart for details).   Patient is well-appearing, normotensive, afebrile, not tachycardic, not tachypneic, oxygenating well on room air.    1. Cellulitis of left lower extremity Will treat for cellulitis with Keflex 4 times daily for 5 days Supportive care and wound care discussed with patient Strict ER and return precautions also discussed Note given for work  The patient was given the opportunity to ask questions.  All questions answered to their satisfaction.  The patient is in agreement to this plan.    Final Clinical Impressions(s) / UC Diagnoses   Final diagnoses:  Cellulitis of left lower extremity     Discharge Instructions      Take the Keflex as prescribed to treat the superficial skin infection in your left leg.  The redness may spread for 48 hours, however then should improve.  You can use cool compresses to help with pain and swelling.  Clean area twice daily with mild soap and water and apply a Band-Aid if the area is draining.  If the area is not draining, you can leave it open to air and apply a thin layer of petroleum jelly or Aquaphor.  Seek care if symptoms worsen, specifically if you have fever, nausea/vomiting, are unable to eat or keep fluids down, please go to the ER.     ED Prescriptions     Medication Sig Dispense Auth. Provider   cephALEXin (KEFLEX) 500 MG capsule Take 1 capsule (500 mg total) by mouth 4 (four) times daily for 5 days. 20 capsule Valentino Nose, NP      PDMP not reviewed this encounter.   Valentino Nose, NP 09/01/22 [phone]

## 2022-09-04 ENCOUNTER — Telehealth: Payer: 59 | Admitting: Physician Assistant

## 2022-09-04 DIAGNOSIS — L03116 Cellulitis of left lower limb: Secondary | ICD-10-CM

## 2022-09-04 MED ORDER — DOXYCYCLINE HYCLATE 100 MG PO TABS
100.0000 mg | ORAL_TABLET | Freq: Two times a day (BID) | ORAL | 0 refills | Status: DC
Start: 2022-09-04 — End: 2023-06-17

## 2022-09-04 NOTE — Patient Instructions (Signed)
  [name], thank you for joining [name], PA-C for today's virtual visit.  While this provider is not your primary care provider (PCP), if your PCP is located in our provider database this encounter information will be shared with them immediately following your visit.   A [facility] MyChart account gives you access to today's visit and all your visits, tests, and labs performed at [facility] " click here if you don't have a [facility] MyChart account or go to [email or web address]  Consent: (Patient) Aneisa Blanche provided verbal consent for this virtual visit at the beginning of the encounter.  Current Medications:  Current Outpatient Medications:    ALPRAZolam (XANAX) 0.5 MG tablet, Take 1 tablet (0.5 mg total) by mouth 3 (three) times daily as needed for anxiety., Disp: 15 tablet, Rfl: 0   cephALEXin (KEFLEX) 500 MG capsule, Take 1 capsule (500 mg total) by mouth 4 (four) times daily for 5 days., Disp: 20 capsule, Rfl: 0   cetirizine (ZYRTEC ALLERGY) 10 MG tablet, Take 1 tablet (10 mg total) by mouth daily., Disp: 30 tablet, Rfl: 0   dicyclomine (BENTYL) 20 MG tablet, Take 1 tablet (20 mg total) by mouth 2 (two) times daily., Disp: 20 tablet, Rfl: 0   ibuprofen (ADVIL) 600 MG tablet, Take 1 tablet (600 mg total) by mouth every 6 (six) hours as needed., Disp: 30 tablet, Rfl: 0   levonorgestrel (MIRENA) 20 MCG/DAY IUD, 1 each by Intrauterine route once., Disp: , Rfl:    ondansetron (ZOFRAN-ODT) 8 MG disintegrating tablet, Take 1 tablet (8 mg total) by mouth every 8 (eight) hours as needed for nausea or vomiting., Disp: 20 tablet, Rfl: 0   pseudoephedrine (SUDAFED) 60 MG tablet, Take 1 tablet (60 mg total) by mouth every 8 (eight) hours as needed for congestion., Disp: 30 tablet, Rfl: 0   sertraline (ZOLOFT) 100 MG tablet, Take 3 tablets (300 mg total) by mouth daily., Disp: 270 tablet, Rfl: 1   Medications ordered in this encounter:  No orders of the  defined types were placed in this encounter.    *If you need refills on other medications prior to your next appointment, please contact your pharmacy*  Follow-Up: Call back or seek an in-person evaluation if the symptoms worsen or if the condition fails to improve as anticipated.  [facility] [phone]  Other Instructions Stop the Keflex. Start the Doxycycline as directed. Alternate warm and cold compresses. Keep skin clean and dry. If no substantial improvement within 48-72 hours or anything continuing to progress or you note fever, please seek an in-person evaluation ASAP. DO NOT DELAY CARE.   If you have been instructed to have an in-person evaluation today at a local Urgent Care facility, please use the link below. It will take you to a list of all of our available [place] [facility], including address, phone number and hours of operation. Please do not delay care.  [facility]  If you or a family member do not have a primary care provider, use the link below to schedule a visit and establish care. When you choose a [facility] primary care physician or advanced practice provider, you gain a long-term partner in health. Find a Primary Care Provider  Learn more about [facility]'s in-office and virtual care options: [facility] - Get Care Now

## 2022-09-04 NOTE — Progress Notes (Signed)
Virtual Visit Consent   Gina Gillespie, you are scheduled for a virtual visit with a [facility] provider today. Just as with appointments in the office, your consent must be obtained to participate. Your consent will be active for this visit and any virtual visit you may have with one of our providers in the next 365 days. If you have a MyChart account, a copy of this consent can be sent to you electronically.  As this is a virtual visit, video technology does not allow for your provider to perform a traditional examination. This may limit your provider's ability to fully assess your condition. If your provider identifies any concerns that need to be evaluated in person or the need to arrange testing (such as labs, EKG, etc.), we will make arrangements to do so. Although advances in technology are sophisticated, we cannot ensure that it will always work on either your end or our end. If the connection with a video visit is poor, the visit may have to be switched to a telephone visit. With either a video or telephone visit, we are not always able to ensure that we have a secure connection.  By engaging in this virtual visit, you consent to the provision of healthcare and authorize for your insurance to be billed (if applicable) for the services provided during this visit. Depending on your insurance coverage, you may receive a charge related to this service.  I need to obtain your verbal consent now. Are you willing to proceed with your visit today? Gina Gillespie has provided verbal consent on 09/04/2022 for a virtual visit (video or telephone). Gina Gillespie, [place]  Date: 09/04/2022 2:05 PM  Virtual Visit via Video Note   I, Gina Gillespie, connected with  Oyindamola Munter  ([id number], 1990-06-25) on 09/04/22 at  2:15 PM EDT by a video-enabled telemedicine application and verified that I am speaking with the correct person using two identifiers.  Location: Patient: Virtual Visit Location  Patient: Home Provider: Virtual Visit Location Provider: Home Office   I discussed the limitations of evaluation and management by telemedicine and the availability of in person appointments. The patient expressed understanding and agreed to proceed.    History of Present Illness: Gina Gillespie is a 32 y.o. who identifies as a female who was assigned female at birth, and is being seen today for continued and progressive cellulitis of L leg despite use of Keflex over the past  few days.  Noted increasing redness and tenderness with some continued occasional drainage. Denies fever, chills. Tenderness is about 4/10.    HPI: HPI  Problems:  Patient Active Problem List   Diagnosis Date Noted   Stressful life event affecting family 08/27/2018   Supervision of low-risk pregnancy 08/11/2018   Severe recurrent major depression without psychotic features (HCC) 07/05/2018   GAD (generalized anxiety disorder) 03/22/2018   Bilateral leg pain 08/09/2017    Allergies: No Known Allergies Medications:  Current Outpatient Medications:    doxycycline (VIBRA-TABS) 100 MG tablet, Take 1 tablet (100 mg total) by mouth 2 (two) times daily., Disp: 14 tablet, Rfl: 0   ALPRAZolam (XANAX) 0.5 MG tablet, Take 1 tablet (0.5 mg total) by mouth 3 (three) times daily as needed for anxiety., Disp: 15 tablet, Rfl: 0   cetirizine (ZYRTEC ALLERGY) 10 MG tablet, Take 1 tablet (10 mg total) by mouth daily., Disp: 30 tablet, Rfl: 0   dicyclomine (BENTYL) 20 MG tablet, Take 1 tablet (20 mg total) by mouth 2 (two) times  daily., Disp: 20 tablet, Rfl: 0   ibuprofen (ADVIL) 600 MG tablet, Take 1 tablet (600 mg total) by mouth every 6 (six) hours as needed., Disp: 30 tablet, Rfl: 0   levonorgestrel (MIRENA) 20 MCG/DAY IUD, 1 each by Intrauterine route once., Disp: , Rfl:    ondansetron (ZOFRAN-ODT) 8 MG disintegrating tablet, Take 1 tablet (8 mg total) by mouth every 8 (eight) hours as needed for nausea or vomiting., Disp: 20  tablet, Rfl: 0   pseudoephedrine (SUDAFED) 60 MG tablet, Take 1 tablet (60 mg total) by mouth every 8 (eight) hours as needed for congestion., Disp: 30 tablet, Rfl: 0   sertraline (ZOLOFT) 100 MG tablet, Take 3 tablets (300 mg total) by mouth daily., Disp: 270 tablet, Rfl: 1  Observations/Objective: Patient is well-developed, well-nourished in no acute distress.  Resting comfortably at home.  Head is normocephalic, atraumatic.  No labored breathing. Speech is clear and coherent with logical content.  Patient is alert and oriented at baseline.  5 x 6 cm area of erythema noted of left calf with a central superficial ulceration at site of prior pustule. No necrosis noted. No noted lymphatic streaking.  Assessment and Plan: 1. Cellulitis of left lower extremity - doxycycline (VIBRA-TABS) 100 MG tablet; Take 1 tablet (100 mg total) by mouth 2 (two) times daily.  Dispense: 14 tablet; Refill: 0  Stop Keflex. Start Doxycycline. Wound care reviewed. If no improvement within 48-72 hours or any new/worsening symptoms after next 24 hours, she is to seek an in-person assessment ASAP.  Follow Up Instructions: I discussed the assessment and treatment plan with the patient. The patient was provided an opportunity to ask questions and all were answered. The patient agreed with the plan and demonstrated an understanding of the instructions.  A copy of instructions were sent to the patient via MyChart unless otherwise noted below.   The patient was advised to call back or seek an in-person evaluation if the symptoms worsen or if the condition fails to improve as anticipated.  Time:  I spent 10 minutes with the patient via telehealth technology discussing the above problems/concerns.    Gina Climes, PA-C

## 2022-09-20 ENCOUNTER — Other Ambulatory Visit: Payer: Self-pay | Admitting: Psychiatry

## 2022-09-20 DIAGNOSIS — F422 Mixed obsessional thoughts and acts: Secondary | ICD-10-CM

## 2022-09-20 DIAGNOSIS — F331 Major depressive disorder, recurrent, moderate: Secondary | ICD-10-CM

## 2022-09-20 DIAGNOSIS — F4001 Agoraphobia with panic disorder: Secondary | ICD-10-CM

## 2022-09-21 NOTE — Telephone Encounter (Signed)
Has appt 8/28

## 2022-09-23 NOTE — Telephone Encounter (Signed)
Patient has enough medication to get to appt.

## 2022-09-24 ENCOUNTER — Ambulatory Visit (INDEPENDENT_AMBULATORY_CARE_PROVIDER_SITE_OTHER): Payer: 59 | Admitting: Psychiatry

## 2022-09-24 ENCOUNTER — Encounter: Payer: Self-pay | Admitting: Psychiatry

## 2022-09-24 DIAGNOSIS — F5081 Binge eating disorder: Secondary | ICD-10-CM | POA: Diagnosis not present

## 2022-09-24 DIAGNOSIS — F4001 Agoraphobia with panic disorder: Secondary | ICD-10-CM | POA: Diagnosis not present

## 2022-09-24 DIAGNOSIS — F422 Mixed obsessional thoughts and acts: Secondary | ICD-10-CM

## 2022-09-24 DIAGNOSIS — F902 Attention-deficit hyperactivity disorder, combined type: Secondary | ICD-10-CM | POA: Diagnosis not present

## 2022-09-24 DIAGNOSIS — F50811 Binge eating disorder, moderate: Secondary | ICD-10-CM

## 2022-09-24 DIAGNOSIS — F331 Major depressive disorder, recurrent, moderate: Secondary | ICD-10-CM

## 2022-09-24 MED ORDER — LISDEXAMFETAMINE DIMESYLATE 40 MG PO CAPS
40.0000 mg | ORAL_CAPSULE | ORAL | 0 refills | Status: DC
Start: 2022-09-24 — End: 2022-10-14

## 2022-09-24 MED ORDER — ALPRAZOLAM 0.5 MG PO TABS
0.5000 mg | ORAL_TABLET | Freq: Three times a day (TID) | ORAL | 0 refills | Status: DC | PRN
Start: 2022-09-24 — End: 2023-04-13

## 2022-09-24 MED ORDER — SERTRALINE HCL 100 MG PO TABS
300.0000 mg | ORAL_TABLET | Freq: Every day | ORAL | 1 refills | Status: DC
Start: 2022-09-24 — End: 2022-11-13
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
## 2022-10-13 ENCOUNTER — Telehealth: Payer: Self-pay | Admitting: Psychiatry

## 2022-10-13 NOTE — Telephone Encounter (Signed)
Next visit is 11/11/22. [name], Gina Gillespie's mom, called and he took two days of the ADHD medication and then stopped due to feeling like he was jumping out of his skin. Mom's number is [phone].  Pharmacy is:  [facility], [address] [place], [place], [place] [address] Phone number is [phone].

## 2022-10-13 NOTE — Telephone Encounter (Signed)
ERROR

## 2022-10-13 NOTE — Telephone Encounter (Signed)
This note is attached to the wrong patient.Error

## 2022-10-14 ENCOUNTER — Other Ambulatory Visit: Payer: Self-pay

## 2022-10-14 ENCOUNTER — Telehealth: Payer: Self-pay | Admitting: Psychiatry

## 2022-10-14 DIAGNOSIS — F5081 Binge eating disorder: Secondary | ICD-10-CM

## 2022-10-14 DIAGNOSIS — F902 Attention-deficit hyperactivity disorder, combined type: Secondary | ICD-10-CM

## 2022-10-14 MED ORDER — LISDEXAMFETAMINE DIMESYLATE 40 MG PO CAPS
40.0000 mg | ORAL_CAPSULE | ORAL | 0 refills | Status: DC
Start: 2022-10-14 — End: 2022-11-10

## 2022-10-14 NOTE — Telephone Encounter (Signed)
Canceled previous Rx at [facility] and repended for [facility].

## 2022-10-14 NOTE — Telephone Encounter (Signed)
Gina Gillespie is requesting a refill on her Vyvanse 40 mg called to [facility] on [address], [place], [place]. Phone number is [phone].  She checked and it is in stock at this location.

## 2022-11-10 ENCOUNTER — Encounter: Payer: Self-pay | Admitting: Psychiatry

## 2022-11-10 ENCOUNTER — Ambulatory Visit (INDEPENDENT_AMBULATORY_CARE_PROVIDER_SITE_OTHER): Payer: 59 | Admitting: Psychiatry

## 2022-11-10 DIAGNOSIS — F902 Attention-deficit hyperactivity disorder, combined type: Secondary | ICD-10-CM

## 2022-11-10 DIAGNOSIS — F422 Mixed obsessional thoughts and acts: Secondary | ICD-10-CM

## 2022-11-10 DIAGNOSIS — F50811 Binge eating disorder, moderate: Secondary | ICD-10-CM

## 2022-11-10 DIAGNOSIS — F4001 Agoraphobia with panic disorder: Secondary | ICD-10-CM

## 2022-11-10 DIAGNOSIS — F331 Major depressive disorder, recurrent, moderate: Secondary | ICD-10-CM

## 2022-11-10 MED ORDER — LISDEXAMFETAMINE DIMESYLATE 50 MG PO CAPS
50.0000 mg | ORAL_CAPSULE | ORAL | 0 refills | Status: DC
Start: 2022-11-10 — End: 2023-01-05
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
## 2022-11-11 ENCOUNTER — Ambulatory Visit: Payer: 59 | Admitting: Psychiatry

## 2022-11-13 ENCOUNTER — Other Ambulatory Visit: Payer: Self-pay

## 2022-11-13 ENCOUNTER — Telehealth: Payer: Self-pay | Admitting: Psychiatry

## 2022-11-13 DIAGNOSIS — F4001 Agoraphobia with panic disorder: Secondary | ICD-10-CM

## 2022-11-13 DIAGNOSIS — F331 Major depressive disorder, recurrent, moderate: Secondary | ICD-10-CM

## 2022-11-13 DIAGNOSIS — F422 Mixed obsessional thoughts and acts: Secondary | ICD-10-CM

## 2022-11-13 NOTE — Telephone Encounter (Signed)
Pt called at 1:01p for refill of Sertaline to [facility].  She can't use [facility] anymore due to her insurance.  Next appt 12/9

## 2022-11-13 NOTE — Telephone Encounter (Signed)
Pended and canceled at org. Pharmacy.

## 2022-11-14 MED ORDER — SERTRALINE HCL 100 MG PO TABS
300.0000 mg | ORAL_TABLET | Freq: Every day | ORAL | 0 refills | Status: DC
Start: 2022-11-14 — End: 2023-01-05

## 2022-11-14 NOTE — Telephone Encounter (Signed)
Agreed.  Thank you

## 2023-01-05 ENCOUNTER — Telehealth: Payer: 59 | Admitting: Psychiatry

## 2023-01-05 ENCOUNTER — Encounter: Payer: Self-pay | Admitting: Psychiatry

## 2023-01-05 DIAGNOSIS — F902 Attention-deficit hyperactivity disorder, combined type: Secondary | ICD-10-CM

## 2023-01-05 DIAGNOSIS — F4001 Agoraphobia with panic disorder: Secondary | ICD-10-CM

## 2023-01-05 DIAGNOSIS — F422 Mixed obsessional thoughts and acts: Secondary | ICD-10-CM

## 2023-01-05 DIAGNOSIS — F331 Major depressive disorder, recurrent, moderate: Secondary | ICD-10-CM

## 2023-01-05 DIAGNOSIS — F50811 Binge eating disorder, moderate: Secondary | ICD-10-CM

## 2023-01-05 MED ORDER — SERTRALINE HCL 100 MG PO TABS
300.0000 mg | ORAL_TABLET | Freq: Every day | ORAL | 1 refills | Status: DC
Start: 2023-01-05 — End: 2023-04-13

## 2023-01-05 MED ORDER — LISDEXAMFETAMINE DIMESYLATE 60 MG PO CAPS
60.0000 mg | ORAL_CAPSULE | ORAL | 0 refills | Status: DC
Start: 2023-01-05 — End: 2023-02-03
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
## 2023-02-03 ENCOUNTER — Other Ambulatory Visit: Payer: Self-pay

## 2023-02-03 ENCOUNTER — Telehealth: Payer: Self-pay | Admitting: Psychiatry

## 2023-02-03 DIAGNOSIS — F50811 Binge eating disorder, moderate: Secondary | ICD-10-CM

## 2023-02-03 DIAGNOSIS — F902 Attention-deficit hyperactivity disorder, combined type: Secondary | ICD-10-CM

## 2023-02-03 NOTE — Telephone Encounter (Signed)
 I called pt to make follow up appt.  She requested refill of Vyvanse to be sent to [facility].  She is asking for a 90 day script.  Next appt 3/17

## 2023-02-03 NOTE — Telephone Encounter (Signed)
 Pended 90-day supply of Vyvanse to [facility]. Need to cancel Rx at [facility].

## 2023-02-04 MED ORDER — LISDEXAMFETAMINE DIMESYLATE 60 MG PO CAPS
60.0000 mg | ORAL_CAPSULE | ORAL | 0 refills | Status: DC
Start: 2023-02-04 — End: 2023-04-13

## 2023-02-04 NOTE — Telephone Encounter (Signed)
 Canceled Rx at [facility].

## 2023-04-10 ENCOUNTER — Ambulatory Visit
Admission: EM | Admit: 2023-04-10 | Discharge: 2023-04-10 | Disposition: A | Discharge status: Home / Self Care | Attending: Family Medicine | Admitting: Family Medicine

## 2023-04-10 DIAGNOSIS — J309 Allergic rhinitis, unspecified: Secondary | ICD-10-CM

## 2023-04-10 DIAGNOSIS — H109 Unspecified conjunctivitis: Secondary | ICD-10-CM

## 2023-04-10 MED ORDER — TOBRAMYCIN 0.3 % OP SOLN: 1.0000 [drp] | OPHTHALMIC | 0 refills | Status: DC

## 2023-04-10 MED ORDER — PSEUDOEPHEDRINE HCL 60 MG PO TABS: 60.0000 mg | ORAL_TABLET | Freq: Three times a day (TID) | ORAL | 0 refills | Status: DC | PRN

## 2023-04-10 MED ORDER — CETIRIZINE HCL 10 MG PO TABS: 10.0000 mg | ORAL_TABLET | Freq: Every day | ORAL | 0 refills | Status: DC

## 2023-04-10 NOTE — ED Triage Notes (Signed)
 Pt states redness to left eye for the past 2 days with a sore throat and sinus pressure.

## 2023-04-10 NOTE — ED Provider Notes (Signed)
 [facility]  Note:  This document was prepared using Conservation officer, historic buildings and may include unintentional dictation errors.  MRN: [id number] DOB: 11/26/1990  Subjective:   Gina Gillespie is a 33 y.o. female presenting for 2-day history of some left eye redness, irritation.  Has also developed some sinus pressure and throat drainage.  Feels like her right eye is starting to bother her as well.  No current facility-administered medications for this encounter.  Current Outpatient Medications:    ALPRAZolam (XANAX) 0.5 MG tablet, Take 1 tablet (0.5 mg total) by mouth 3 (three) times daily as needed for anxiety., Disp: 15 tablet, Rfl: 0   cetirizine (ZYRTEC ALLERGY) 10 MG tablet, Take 1 tablet (10 mg total) by mouth daily., Disp: 30 tablet, Rfl: 0   dicyclomine (BENTYL) 20 MG tablet, Take 1 tablet (20 mg total) by mouth 2 (two) times daily., Disp: 20 tablet, Rfl: 0   doxycycline (VIBRA-TABS) 100 MG tablet, Take 1 tablet (100 mg total) by mouth 2 (two) times daily., Disp: 14 tablet, Rfl: 0   ibuprofen (ADVIL) 600 MG tablet, Take 1 tablet (600 mg total) by mouth every 6 (six) hours as needed., Disp: 30 tablet, Rfl: 0   levonorgestrel (MIRENA) 20 MCG/DAY IUD, 1 each by Intrauterine route once., Disp: , Rfl:    lisdexamfetamine (VYVANSE) 60 MG capsule, Take 1 capsule (60 mg total) by mouth every morning., Disp: 90 capsule, Rfl: 0   ondansetron (ZOFRAN-ODT) 8 MG disintegrating tablet, Take 1 tablet (8 mg total) by mouth every 8 (eight) hours as needed for nausea or vomiting., Disp: 20 tablet, Rfl: 0   pseudoephedrine (SUDAFED) 60 MG tablet, Take 1 tablet (60 mg total) by mouth every 8 (eight) hours as needed for congestion., Disp: 30 tablet, Rfl: 0   sertraline (ZOLOFT) 100 MG tablet, Take 3 tablets (300 mg total) by mouth daily., Disp: 270 tablet, Rfl: 1   No Known Allergies  Past Medical History:  Diagnosis Date   Anxiety    Depression    History of  chicken pox    OCD (obsessive compulsive disorder)      History reviewed. No pertinent surgical history.  Family History  Problem Relation Age of Onset   Alcohol abuse Father    Cancer Father    Depression Father    Mental illness Father    Throat cancer Father    Mental illness Brother    Arthritis Maternal Grandmother    Depression Maternal Grandmother    Diabetes Maternal Grandmother    Hearing loss Maternal Grandmother    Hypertension Maternal Grandmother    Alcohol abuse Maternal Grandfather    Cancer Maternal Grandfather    Early death Paternal Grandmother    Alcohol abuse Paternal Grandfather    Cancer Paternal Grandfather     Social History   Tobacco Use   Smoking status: Never   Smokeless tobacco: Never  Vaping Use   Vaping status: Never Used  Substance Use Topics   Alcohol use: Not Currently   Drug use: Never    ROS   Objective:   Vitals: BP 103/70 (BP Location: Right Arm)   Pulse 91   Temp 98.1 F (36.7 C) (Oral)   Resp 16   LMP 04/08/2023 (Approximate)   SpO2 96%   Physical Exam Constitutional:      General: She is not in acute distress.    Appearance: Normal appearance. She is well-developed and normal weight. She is not ill-appearing, toxic-appearing or  diaphoretic.  HENT:     Head: Normocephalic and atraumatic.     Right Ear: Tympanic membrane, ear canal and external ear normal. No drainage or tenderness. No middle ear effusion. There is no impacted cerumen. Tympanic membrane is not erythematous or bulging.     Left Ear: Tympanic membrane, ear canal and external ear normal. No drainage or tenderness.  No middle ear effusion. There is no impacted cerumen. Tympanic membrane is not erythematous or bulging.     Nose: Nose normal. No congestion or rhinorrhea.     Mouth/Throat:     Mouth: Mucous membranes are moist. No oral lesions.     Pharynx: No pharyngeal swelling, oropharyngeal exudate, posterior oropharyngeal erythema or uvula swelling.      Tonsils: No tonsillar exudate or tonsillar abscesses.  Eyes:     General: Lids are normal. Lids are everted, no foreign bodies appreciated. Vision grossly intact. No scleral icterus.       Right eye: No foreign body, discharge or hordeolum.        Left eye: No foreign body, discharge or hordeolum.     Extraocular Movements: Extraocular movements intact.     Right eye: Normal extraocular motion.     Left eye: Normal extraocular motion and no nystagmus.     Conjunctiva/sclera:     Right eye: Right conjunctiva is not injected. No chemosis, exudate or hemorrhage.    Left eye: Left conjunctiva is injected. No chemosis, exudate or hemorrhage. Cardiovascular:     Rate and Rhythm: Normal rate.  Pulmonary:     Effort: Pulmonary effort is normal.  Musculoskeletal:     Cervical back: Normal range of motion and neck supple.  Lymphadenopathy:     Cervical: No cervical adenopathy.  Skin:    General: Skin is warm and dry.  Neurological:     General: No focal deficit present.     Mental Status: She is alert and oriented to person, place, and time.  Psychiatric:        Mood and Affect: Mood normal.        Behavior: Behavior normal.     Assessment and Plan :   PDMP not reviewed this encounter.  1. Bacterial conjunctivitis of left eye   2. Allergic rhinitis, unspecified seasonality, unspecified trigger    Recommend starting treatment for bacterial conjunctivitis of the left eye with tobramycin.  Recommend Zyrtec and pseudoephedrine, supportive care for suspected allergic rhinitis.  Counseled patient on potential for adverse effects with medications prescribed/recommended today, ER and return-to-clinic precautions discussed, patient verbalized understanding.    Wallis Bamberg, [place] 04/10/23 1645

## 2023-04-13 ENCOUNTER — Ambulatory Visit (INDEPENDENT_AMBULATORY_CARE_PROVIDER_SITE_OTHER): Payer: 59 | Admitting: Psychiatry

## 2023-04-13 DIAGNOSIS — F331 Major depressive disorder, recurrent, moderate: Secondary | ICD-10-CM

## 2023-04-13 DIAGNOSIS — F422 Mixed obsessional thoughts and acts: Secondary | ICD-10-CM | POA: Diagnosis not present

## 2023-04-13 DIAGNOSIS — F4001 Agoraphobia with panic disorder: Secondary | ICD-10-CM | POA: Diagnosis not present

## 2023-04-13 DIAGNOSIS — F902 Attention-deficit hyperactivity disorder, combined type: Secondary | ICD-10-CM

## 2023-04-13 DIAGNOSIS — F50811 Binge eating disorder, moderate: Secondary | ICD-10-CM

## 2023-04-13 MED ORDER — SERTRALINE HCL 100 MG PO TABS
300.0000 mg | ORAL_TABLET | Freq: Every day | ORAL | 1 refills | Status: DC
Start: 2023-04-13 — End: 2023-09-30

## 2023-04-13 MED ORDER — ALPRAZOLAM 0.5 MG PO TABS
0.5000 mg | ORAL_TABLET | Freq: Three times a day (TID) | ORAL | 0 refills | Status: DC | PRN
Start: 2023-04-13 — End: 2023-12-07

## 2023-04-13 MED ORDER — LISDEXAMFETAMINE DIMESYLATE 70 MG PO CAPS
70.0000 mg | ORAL_CAPSULE | ORAL | 0 refills | Status: DC
Start: 2023-04-13 — End: 2023-09-30
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
## 2023-06-15 ENCOUNTER — Ambulatory Visit
Admission: EM | Admit: 2023-06-15 | Discharge: 2023-06-15 | Disposition: A | Discharge status: Home / Self Care | Attending: Family Medicine | Admitting: Family Medicine

## 2023-06-15 DIAGNOSIS — R1084 Generalized abdominal pain: Secondary | ICD-10-CM

## 2023-06-15 DIAGNOSIS — K529 Noninfective gastroenteritis and colitis, unspecified: Secondary | ICD-10-CM | POA: Diagnosis not present

## 2023-06-15 DIAGNOSIS — R197 Diarrhea, unspecified: Secondary | ICD-10-CM

## 2023-06-15 LAB — POCT URINALYSIS DIP (MANUAL ENTRY)
Bilirubin, UA: NEGATIVE
Blood, UA: NEGATIVE
Glucose, UA: NEGATIVE mg/dL
Ketones, POC UA: NEGATIVE mg/dL
Leukocytes, UA: NEGATIVE
Nitrite, UA: NEGATIVE
Protein Ur, POC: NEGATIVE mg/dL
Spec Grav, UA: 1.025
Urobilinogen, UA: 0.2 U/dL
pH, UA: 5.5

## 2023-06-15 MED ORDER — ONDANSETRON 8 MG PO TBDP: 8.0000 mg | ORAL_TABLET | Freq: Three times a day (TID) | ORAL | 0 refills | Status: DC | PRN

## 2023-06-15 MED ORDER — DICYCLOMINE HCL 20 MG PO TABS: 20.0000 mg | ORAL_TABLET | Freq: Two times a day (BID) | ORAL | 0 refills | Status: DC

## 2023-06-15 NOTE — ED Provider Notes (Signed)
 [facility]  Note:  This document was prepared using Conservation officer, historic buildings and may include unintentional dictation errors.  MRN: [id number] DOB: Jun 07, 1990  Subjective:   Chelcee Korpi is a 33 y.o. female presenting for acute onset overnight of generalized belly pain, diarrhea.  Had an episode of nausea and vomiting in the middle of the night as well.  Had similar symptoms 2 weeks ago when she started semaglutide.  She is still taking this medication but the plan is to stop with this treatment.  No fever, recent antibiotic use, hospitalizations or long distance travel.  Has not eaten raw foods, drank unfiltered water.  No history of GI disorders including Crohn's, IBS, ulcerative colitis.   No current facility-administered medications for this encounter.  Current Outpatient Medications:    ALPRAZolam  (XANAX ) 0.5 MG tablet, Take 1 tablet (0.5 mg total) by mouth 3 (three) times daily as needed for anxiety., Disp: 15 tablet, Rfl: 0   cetirizine  (ZYRTEC  ALLERGY) 10 MG tablet, Take 1 tablet (10 mg total) by mouth daily., Disp: 30 tablet, Rfl: 0   dicyclomine  (BENTYL ) 20 MG tablet, Take 1 tablet (20 mg total) by mouth 2 (two) times daily., Disp: 20 tablet, Rfl: 0   doxycycline  (VIBRA -TABS) 100 MG tablet, Take 1 tablet (100 mg total) by mouth 2 (two) times daily., Disp: 14 tablet, Rfl: 0   ibuprofen  (ADVIL ) 600 MG tablet, Take 1 tablet (600 mg total) by mouth every 6 (six) hours as needed., Disp: 30 tablet, Rfl: 0   levonorgestrel  (MIRENA ) 20 MCG/DAY IUD, 1 each by Intrauterine route once., Disp: , Rfl:    lisdexamfetamine (VYVANSE ) 70 MG capsule, Take 1 capsule (70 mg total) by mouth every morning., Disp: 90 capsule, Rfl: 0   ondansetron  (ZOFRAN -ODT) 8 MG disintegrating tablet, Take 1 tablet (8 mg total) by mouth every 8 (eight) hours as needed for nausea or vomiting., Disp: 20 tablet, Rfl: 0   pseudoephedrine  (SUDAFED) 60 MG tablet, Take 1 tablet (60 mg total)  by mouth every 8 (eight) hours as needed for congestion., Disp: 30 tablet, Rfl: 0   sertraline  (ZOLOFT ) 100 MG tablet, Take 3 tablets (300 mg total) by mouth daily., Disp: 270 tablet, Rfl: 1   tobramycin  (TOBREX ) 0.3 % ophthalmic solution, Place 1 drop into the left eye every 4 (four) hours., Disp: 5 mL, Rfl: 0   No Known Allergies  Past Medical History:  Diagnosis Date   Anxiety    Depression    History of chicken pox    OCD (obsessive compulsive disorder)      History reviewed. No pertinent surgical history.  Family History  Problem Relation Age of Onset   Alcohol abuse Father    Cancer Father    Depression Father    Mental illness Father    Throat cancer Father    Mental illness Brother    Arthritis Maternal Grandmother    Depression Maternal Grandmother    Diabetes Maternal Grandmother    Hearing loss Maternal Grandmother    Hypertension Maternal Grandmother    Alcohol abuse Maternal Grandfather    Cancer Maternal Grandfather    Early death Paternal Grandmother    Alcohol abuse Paternal Grandfather    Cancer Paternal Grandfather     Social History   Tobacco Use   Smoking status: Never   Smokeless tobacco: Never  Vaping Use   Vaping status: Never Used  Substance Use Topics   Alcohol use: Not Currently   Drug use: Never  ROS   Objective:   Vitals: BP 106/71 (BP Location: Right Arm)   Pulse 95   Temp 99.2 F (37.3 C) (Oral)   Resp 20   SpO2 96%   Wt Readings from Last 3 Encounters:  07/17/21 151 lb (68.5 kg)  06/27/21 148 lb 8 oz (67.4 kg)  06/19/21 151 lb 3.2 oz (68.6 kg)   Temp Readings from Last 3 Encounters:  06/15/23 99.2 F (37.3 C) (Oral)  04/10/23 98.1 F (36.7 C) (Oral)  09/01/22 98.8 F (37.1 C) (Oral)   BP Readings from Last 3 Encounters:  06/15/23 106/71  04/10/23 103/70  09/01/22 111/74   Pulse Readings from Last 3 Encounters:  06/15/23 95  04/10/23 91  09/01/22 93     Physical Exam Constitutional:      General:  She is not in acute distress.    Appearance: Normal appearance. She is well-developed. She is not ill-appearing, toxic-appearing or diaphoretic.  HENT:     Head: Normocephalic and atraumatic.     Nose: Nose normal.     Mouth/Throat:     Mouth: Mucous membranes are moist.  Eyes:     General: No scleral icterus.       Right eye: No discharge.        Left eye: No discharge.     Extraocular Movements: Extraocular movements intact.     Conjunctiva/sclera: Conjunctivae normal.  Cardiovascular:     Rate and Rhythm: Normal rate.  Pulmonary:     Effort: Pulmonary effort is normal.  Abdominal:     General: Bowel sounds are normal. There is no distension.     Palpations: Abdomen is soft. There is no mass.     Tenderness: There is abdominal tenderness (generalized throughout and mild). There is no right CVA tenderness, left CVA tenderness, guarding or rebound.  Skin:    General: Skin is warm and dry.  Neurological:     General: No focal deficit present.     Mental Status: She is alert and oriented to person, place, and time.  Psychiatric:        Mood and Affect: Mood normal.        Behavior: Behavior normal.        Thought Content: Thought content normal.        Judgment: Judgment normal.     Results for orders placed or performed during the hospital encounter of 06/15/23 (from the past 24 hours)  POCT urinalysis dipstick     Status: Normal   Collection Time: 06/15/23 12:56 PM  Result Value Ref Range   Color, UA yellow    Clarity, UA clear    Glucose, UA negative mg/dL   Bilirubin, UA negative    Ketones, POC UA negative mg/dL   Spec Grav, UA 4.098    Blood, UA negative    pH, UA 5.5    Protein Ur, POC negative mg/dL   Urobilinogen, UA 0.2 E.U./dL   Nitrite, UA Negative    Leukocytes, UA Negative     Assessment and Plan :   PDMP not reviewed this encounter.  1. Colitis   2. Diarrhea, unspecified type   3. Generalized abdominal pain    High suspicion that the colitis  she is experiencing is an adverse effect from her GLP-1 agonist.  I am in agreement to stop this medication as she has had consistent side effects from this.  Recommend general supportive care.  No signs of an acute abdomen.  Counseled patient on potential  for adverse effects with medications prescribed/recommended today, ER and return-to-clinic precautions discussed, patient verbalized understanding.    Adolph Hoop, [place] 06/16/23 [phone]

## 2023-06-15 NOTE — ED Triage Notes (Addendum)
 Pt c/o generalized abd pain, diarrhea started last night, n/v started ~4am-states same sx ~2 weeks ago-states she started semaglutide last month-NAD-steady gait

## 2023-06-15 NOTE — Discharge Instructions (Addendum)
Make sure you push fluids drinking mostly water but mix it with Gatorade.  Try to eat light meals including soups, broths and soft foods, fruits.  You may use Zofran for your nausea and vomiting once every 8 hours.  Bentyl can help with diarrhea but use this carefully limiting it to 1-2 times per day only if you are having a lot of diarrhea.  Please return to the clinic if symptoms worsen or you start having severe abdominal pain not helped by taking Tylenol or start having bloody stools or blood in the vomit.  

## 2023-06-17 ENCOUNTER — Ambulatory Visit: Admitting: Certified Nurse Midwife

## 2023-06-17 ENCOUNTER — Other Ambulatory Visit: Payer: Self-pay

## 2023-06-17 VITALS — BP 110/73 | HR 83 | Wt 159.5 lb

## 2023-06-17 DIAGNOSIS — Z01419 Encounter for gynecological examination (general) (routine) without abnormal findings: Secondary | ICD-10-CM | POA: Diagnosis not present

## 2023-06-20 NOTE — Progress Notes (Signed)
 ANNUAL EXAM Patient name: Gina Gillespie MRN [id number]  Date of birth: 05/21/1990 Chief Complaint:   Annual Exam and Gynecologic Exam  History of Present Illness:   Gina Gillespie is a 33 y.o. G8P2002 [place] female being seen today for a routine annual exam.  Current complaints: None aside from her weight which is making her uncomfortable.  Patient's last menstrual period was 06/16/2023.  Last pap 06/19/21. Results were: NILM w/ HRHPV negative. H/O abnormal pap: no Last mammogram: Never (age). Results were: N/A. Family h/o breast cancer: yes aunts, offered BRCA testing Last colonoscopy: Never (age). Results were: normal. Family h/o colorectal cancer: no     06/17/2023    4:11 PM 06/19/2021    3:02 PM 08/11/2019    9:50 AM 08/11/2018    1:50 PM  Depression screen PHQ 2/9  Decreased Interest 1 2 0 1  Down, Depressed, Hopeless 1 2 0 1  PHQ - 2 Score 2 4 0 2  Altered sleeping 1 2  0  Tired, decreased energy 1 2  3   Change in appetite 1 2  3   Feeling bad or failure about yourself  2 2  1   Trouble concentrating 2 2  1   Moving slowly or fidgety/restless 0 1  0  Suicidal thoughts 1 1  0  PHQ-9 Score 10 16  10         06/17/2023    4:12 PM 06/19/2021    3:02 PM 08/11/2018    1:48 PM  GAD 7 : Generalized Anxiety Score  Nervous, Anxious, on Edge 2 2 1   Control/stop worrying 2 2 0  Worry too much - different things 2 2 1   Trouble relaxing 2 2 1   Restless 1 2 0  Easily annoyed or irritable 1 2 1   Afraid - awful might happen 1 1 1   Total GAD 7 Score 11 13 5   Anxiety Difficulty   Not difficult at all     Review of Systems:   Pertinent items are noted in HPI Denies any headaches, blurred vision, fatigue, shortness of breath, chest pain, abdominal pain, abnormal vaginal discharge/itching/odor/irritation, problems with periods, bowel movements, urination, or intercourse unless otherwise stated above.  Pertinent History Reviewed:  Reviewed past medical,surgical, social and  family history.  Reviewed problem list, medications and allergies.  Physical Assessment:   Vitals:   06/17/23 1553  BP: 110/73  Pulse: 83  Weight: 159 lb 8 oz (72.3 kg)   Body mass index is 25.74 kg/m.   Physical Examination:  General appearance - well appearing, and in no distress Mental status - alert, oriented to person, place, and time Psych:  She has a normal mood and affect Skin - warm and dry, normal color, no suspicious lesions noted Chest - effort normal, no problems with respiration noted Heart - normal rate and regular rhythm Neck:  midline trachea, no thyromegaly or nodules Breasts - breasts appear normal, no suspicious masses, no skin or nipple changes or  axillary nodes Abdomen - soft, nontender, nondistended, no masses or organomegaly Pelvic - exam deferred Extremities:  No swelling or varicosities noted  Chaperone present for exam  No results found for this or any previous visit (from the past 24 hours).  Assessment & Plan:  1. Encounter for annual routine gynecological examination (Primary) - Routine preventative health maintenance measures emphasized. - Please refer to After Visit Summary for other counseling recommendations.   Mammogram: @ 33yo, or sooner if problems Colonoscopy: @ 33yo, or sooner if problems  No orders of the defined types were placed in this encounter.  Meds: No orders of the defined types were placed in this encounter.  Follow-up: No follow-ups on file.  Derick Fleeting, CNM 06/20/2023 5:03 PM

## 2023-08-13 ENCOUNTER — Ambulatory Visit (INDEPENDENT_AMBULATORY_CARE_PROVIDER_SITE_OTHER): Admitting: Psychiatry

## 2023-09-30 ENCOUNTER — Other Ambulatory Visit (HOSPITAL_COMMUNITY)
Admission: RE | Admit: 2023-09-30 | Discharge: 2023-09-30 | Disposition: A | Discharge status: Home / Self Care | Source: Ambulatory Visit | Attending: Certified Nurse Midwife | Admitting: Certified Nurse Midwife

## 2023-09-30 ENCOUNTER — Ambulatory Visit (INDEPENDENT_AMBULATORY_CARE_PROVIDER_SITE_OTHER): Admitting: Certified Nurse Midwife

## 2023-09-30 ENCOUNTER — Encounter: Payer: Self-pay | Admitting: Certified Nurse Midwife

## 2023-09-30 ENCOUNTER — Other Ambulatory Visit: Payer: Self-pay

## 2023-09-30 VITALS — BP 114/77 | HR 77 | Ht 66.0 in | Wt 173.0 lb

## 2023-09-30 DIAGNOSIS — Z202 Contact with and (suspected) exposure to infections with a predominantly sexual mode of transmission: Secondary | ICD-10-CM

## 2023-10-01 LAB — CERVICOVAGINAL ANCILLARY ONLY
Chlamydia: NEGATIVE
Comment: NEGATIVE
Comment: NEGATIVE
Comment: NORMAL
Neisseria Gonorrhea: NEGATIVE
Trichomonas: NEGATIVE

## 2023-10-01 LAB — HIV ANTIBODY (ROUTINE TESTING W REFLEX): HIV Screen 4th Generation wRfx: NONREACTIVE

## 2023-10-01 LAB — RPR: RPR Ser Ql: NONREACTIVE

## 2023-10-05 ENCOUNTER — Ambulatory Visit

## 2023-10-06 NOTE — Progress Notes (Signed)
 History:  Ms. Gina Gillespie is a 33 y.o. G3P2002 who presents to clinic today for STI testing, also wanted to make sure she was not exposed to HPV. Current sexual partner had it 30 years ago and has not had any additional symptoms (he had a strain that caused genital warts).   The following portions of the patient's history were reviewed and updated as appropriate: allergies, current medications, family history, past medical history, social history, past surgical history and problem list.  Review of Systems:  Pertinent items noted in HPI and remainder of comprehensive ROS otherwise negative.   Objective:  Physical Exam BP 114/77   Pulse 77   Ht 5' 6 (1.676 m)   Wt 173 lb (78.5 kg)   BMI 27.92 kg/m  Physical Exam Vitals and nursing note reviewed.  Constitutional:      General: She is not in acute distress.    Appearance: Normal appearance. She is normal weight. She is not ill-appearing.  Eyes:     Pupils: Pupils are equal, round, and reactive to light.  Cardiovascular:     Rate and Rhythm: Normal rate and regular rhythm.     Pulses: Normal pulses.  Pulmonary:     Effort: Pulmonary effort is normal.  Musculoskeletal:        General: Normal range of motion.     Cervical back: Normal range of motion.  Skin:    General: Skin is warm and dry.     Capillary Refill: Capillary refill takes less than 2 seconds.  Neurological:     Mental Status: She is alert and oriented to person, place, and time.  Psychiatric:        Mood and Affect: Mood normal.        Behavior: Behavior normal.    Labs and Imaging No results found for this or any previous visit (from the past 24 hours).  No results found.   Assessment & Plan:  1. Possible exposure to STI (Primary) - Explained how viral infections are transient and reassured that we would not decrease the amount of time between screening paps due to possible exposure. Can continue with normal pap interval. - Cervicovaginal ancillary only(  [place]) - HIV antibody (with reflex) - RPR  Follow up PRN or for annual exam.   Gina Gillespie, CNM 10/06/2023 9:09 PM

## 2023-12-06 ENCOUNTER — Ambulatory Visit
Admission: EM | Admit: 2023-12-06 | Discharge: 2023-12-06 | Disposition: A | Discharge status: Home / Self Care | Attending: Family Medicine | Admitting: Family Medicine

## 2023-12-06 DIAGNOSIS — E860 Dehydration: Secondary | ICD-10-CM | POA: Diagnosis not present

## 2023-12-06 DIAGNOSIS — R112 Nausea with vomiting, unspecified: Secondary | ICD-10-CM | POA: Diagnosis not present

## 2023-12-06 DIAGNOSIS — R197 Diarrhea, unspecified: Secondary | ICD-10-CM

## 2023-12-06 MED ORDER — LOPERAMIDE HCL 2 MG PO CAPS: 2.0000 mg | ORAL_CAPSULE | Freq: Two times a day (BID) | ORAL | 0 refills | Status: AC | PRN

## 2023-12-06 MED ORDER — ONDANSETRON HCL 4 MG/2ML IJ SOLN
4.0000 mg | Freq: Once | INTRAMUSCULAR | Status: AC
  Administered 2023-12-06: 4 mg via INTRAVENOUS

## 2023-12-06 MED ORDER — SODIUM CHLORIDE 0.9 % IV BOLUS
1000.0000 mL | Freq: Once | INTRAVENOUS | Status: AC
  Administered 2023-12-06: 1000 mL via INTRAVENOUS

## 2023-12-06 MED ORDER — ONDANSETRON 8 MG PO TBDP: 8.0000 mg | ORAL_TABLET | Freq: Three times a day (TID) | ORAL | 0 refills | Status: AC | PRN

## 2023-12-06 NOTE — ED Triage Notes (Signed)
 Pt reports nausea, diarrhea and vomiting x 1 day.

## 2023-12-06 NOTE — Discharge Instructions (Addendum)

## 2023-12-06 NOTE — ED Provider Notes (Signed)
 [facility]  Note:  This document was prepared using Conservation officer, historic buildings and may include unintentional dictation errors.  MRN: [id number] DOB: July 22, 1990  Subjective:   Gina Gillespie is a 33 y.o. female presenting for 1 day history of numerous episodes of nausea with vomiting, watery diarrhea.   Has started having anxiety and panic attacks about how much she has been vomiting and having diarrhea as this has elicited moderate to severe belly pain overnight.  Reports that it has improved this morning but is still lingering.  She did take Zofran  last night.  Has been taking tirzepatide for the past 6 weeks.  Plans on discontinuing this medication.  No fever, bloody stools, recent antibiotic use, hospitalizations or long distance travel.  Has not eaten raw foods, drank unfiltered water.  No history of GI disorders including Crohn's, IBS, ulcerative colitis.   No current facility-administered medications for this encounter.  Current Outpatient Medications:    ALPRAZolam  (XANAX ) 0.5 MG tablet, Take 1 tablet (0.5 mg total) by mouth 3 (three) times daily as needed for anxiety., Disp: 15 tablet, Rfl: 0   levonorgestrel  (MIRENA ) 20 MCG/DAY IUD, 1 each by Intrauterine route once., Disp: , Rfl:    sertraline  (ZOLOFT ) 100 MG tablet, Take 300 mg by mouth daily., Disp: , Rfl:    No Known Allergies  Past Medical History:  Diagnosis Date   Anxiety    Depression    History of chicken pox    OCD (obsessive compulsive disorder)      History reviewed. No pertinent surgical history.  Family History  Problem Relation Age of Onset   Alcohol abuse Father    Cancer Father    Depression Father    Mental illness Father    Throat cancer Father    Mental illness Brother    Arthritis Maternal Grandmother    Depression Maternal Grandmother    Diabetes Maternal Grandmother    Hearing loss Maternal Grandmother    Hypertension Maternal Grandmother    Alcohol abuse  Maternal Grandfather    Cancer Maternal Grandfather    Early death Paternal Grandmother    Alcohol abuse Paternal Grandfather    Cancer Paternal Grandfather     Social History   Tobacco Use   Smoking status: Never   Smokeless tobacco: Never  Vaping Use   Vaping status: Never Used  Substance Use Topics   Alcohol use: Not Currently   Drug use: Never    ROS   Objective:   Vitals: BP 95/67 (BP Location: Right Arm)   Pulse (!) 102   Temp 98.3 F (36.8 C) (Oral)   Resp 16   SpO2 97%   Physical Exam Constitutional:      General: She is not in acute distress.    Appearance: Normal appearance. She is well-developed. She is not ill-appearing, toxic-appearing or diaphoretic.  HENT:     Head: Normocephalic and atraumatic.     Nose: Nose normal.     Mouth/Throat:     Mouth: Mucous membranes are dry.  Eyes:     General: No scleral icterus.       Right eye: No discharge.        Left eye: No discharge.     Extraocular Movements: Extraocular movements intact.     Conjunctiva/sclera: Conjunctivae normal.  Cardiovascular:     Rate and Rhythm: Normal rate.  Pulmonary:     Effort: Pulmonary effort is normal.  Abdominal:     General: Bowel  sounds are normal. There is no distension.     Palpations: Abdomen is soft. There is no mass.     Tenderness: There is generalized abdominal tenderness. There is no right CVA tenderness, left CVA tenderness, guarding or rebound.  Skin:    General: Skin is warm and dry.  Neurological:     General: No focal deficit present.     Mental Status: She is alert and oriented to person, place, and time.  Psychiatric:        Mood and Affect: Mood normal.        Behavior: Behavior normal.        Thought Content: Thought content normal.        Judgment: Judgment normal.    IV fluid bolus administered at 1,000cc over a period of 50 minutes.  Patient was unable to provide and urine sample in clinic.  Assessment and Plan :   PDMP not reviewed  this encounter.  1. Nausea vomiting and diarrhea   2. Dehydration    Rehydration as above.  Suspect that her GI symptoms are related to her new treatment with tirzepatide.  Counseled patient on potential for adverse effects with medications prescribed/recommended today, ER and return-to-clinic precautions discussed, patient verbalized understanding.    Christopher Savannah, PA-C 12/06/23 1312

## 2023-12-07 ENCOUNTER — Telehealth: Payer: Self-pay | Admitting: Psychiatry

## 2023-12-07 ENCOUNTER — Other Ambulatory Visit: Payer: Self-pay

## 2023-12-07 DIAGNOSIS — F4001 Agoraphobia with panic disorder: Secondary | ICD-10-CM

## 2023-12-07 MED ORDER — ALPRAZOLAM 0.5 MG PO TABS
0.5000 mg | ORAL_TABLET | Freq: Three times a day (TID) | ORAL | 0 refills | Status: DC | PRN
Start: 2023-12-07 — End: 2023-12-08

## 2023-12-07 NOTE — Telephone Encounter (Signed)
 BC severity of sx and I don't have opening anytime soon, schedule her with Priya ASAP.  I will FU with her thereafter ASAP  Priya if she is off sertraline , get her back on it and establish safety.  I'll send in short supply Xanax .

## 2023-12-07 NOTE — Telephone Encounter (Signed)
 Pt called in tears for apt. Feels meds aren't working. Apt 12/31 on canc list. RTC ASAP [phone]

## 2023-12-07 NOTE — Telephone Encounter (Addendum)
 Pt last seen in March, was due for FU in July. She called today to ask for some Xanax . She is reporting depression at 8/10, anxiety 10/10. She said she was sick over the weekend with N/V and that triggers her because she is afraid of throwing up. No SI. She is sleeping. She said the Xanax  makes her sleepy, which she likes because it is an escape. She is in therapy and will have a telehealth visit tonight. She has some situational issues, co-parenting, no family support.  She called out of work today but said she doesn't have any more PTO left and will lose her job if she misses more. Conversation is difficult due to her crying. It doesn't look like she is still taking the sertraline . She is asking for a RF of Xanax , which I will pend separately.

## 2023-12-08 ENCOUNTER — Ambulatory Visit: Admitting: Emergency Medicine

## 2023-12-08 ENCOUNTER — Encounter: Payer: Self-pay | Admitting: Emergency Medicine

## 2023-12-08 DIAGNOSIS — F332 Major depressive disorder, recurrent severe without psychotic features: Secondary | ICD-10-CM | POA: Diagnosis not present

## 2023-12-08 DIAGNOSIS — F411 Generalized anxiety disorder: Secondary | ICD-10-CM | POA: Diagnosis not present

## 2023-12-08 DIAGNOSIS — F4001 Agoraphobia with panic disorder: Secondary | ICD-10-CM

## 2023-12-08 MED ORDER — ALPRAZOLAM 0.5 MG PO TABS
0.5000 mg | ORAL_TABLET | Freq: Three times a day (TID) | ORAL | 0 refills | Status: AC | PRN
Start: 2023-12-08 — End: ?

## 2023-12-08 NOTE — Progress Notes (Addendum)
 Gina Gillespie [id number] 1990/09/06 33 y.o.  Subjective:   Patient ID:  Gina Gillespie is a 33 y.o. (DOB [birth year]) female.  Chief Complaint:  Chief Complaint  Patient presents with   Follow-up   Anxiety   Depression   HPI Gina Gillespie presents to the office today for follow-up for medication management.  LOV with Dr. Lorene Macintosh on 3/15.   Current Psych Medication: No SE reported. Sertraline  200 mg  Decreased to 200 mg from 300 mg in July due to mild mental fog and wanted to feel more normal Xanax  .25 mg TID PRN for anxiety Has been using sparingly - taking 1/2 tablet of .5mg  PRN Vyvanse  70 mg po AM daily  Pt called office tearful yesterday morning, requesting Xanax , reporting anxiety at 10/10 and depression at 8/10.  Reports weekends are stressful and lonely. Recent panic attack was on Saturday on the way to a friend's house for movie night to simulate a family experience, but was feeling ashamed. She started experiencing N/V and diarrhea, which triggered intense anxiety episodes describing a fever pitch of anxiety.   Reports symptoms are SEs of Tirzepatide which she started on September 17th.  She receives temporary relief from anxiety following vomiting. Reported taking 0.25 mg Xanax  during each anxiety episode but experienced vomiting shortly afterward.   She reports several situational stressors including coparenting challenges and a lack of family support. Feeling lonely and having difficult with divorce. She called out of work 2 days but has no remaining PTO and feels she is at risk of losing her job.  Currently engaged in therapy with telehealth sessions found helpful in managing acute anxiety.  A  FU appointment is scheduled with Dr. Lorene Macintosh on December 31 at 3:30 PM.  Denies mania, delirium, AVH, SI, HI or self-harm behaviors. No further complaints at this time.  GAD-7    Flowsheet Row Office Visit from 06/17/2023 in [facility] [employer]  at [facility] Office Visit from 06/19/2021 in [facility] [employer] at [facility] Video Visit from 08/11/2018 in Center for [facility]  Total GAD-7 Score 11 13 5    PHQ2-9    Flowsheet Row Office Visit from 06/17/2023 in Center for [facility] at [facility] Office Visit from 06/19/2021 in Center for [facility] at [facility] Video Visit from 08/11/2019 in [facility] [place] Video Visit from 08/11/2018 in Center for [facility]  PHQ-2 Total Score 2 4 0 2  PHQ-9 Total Score 10 16 -- 10   Flowsheet Row UC from 12/06/2023 in [facility] UC from 06/15/2023 in [facility]) UC from 04/10/2023 in [facility] at [facility])  C-SSRS RISK CATEGORY No Risk No Risk No Risk     Review of Systems:  Review of Systems  Constitutional:  Positive for fatigue.  Gastrointestinal:  Positive for nausea.  Psychiatric/Behavioral:  The patient is nervous/anxious.        Please refer to HPI.  All other systems reviewed and are negative.   Past medications for mental health diagnoses include: Sertraline  300 Lexapro Xanax    Medications: I have reviewed the patient's current medications.  Current Outpatient Medications  Medication Sig Dispense Refill   ALPRAZolam  (XANAX ) 0.5 MG tablet Take 1 tablet (0.5 mg total) by mouth 3 (three) times daily as needed for  anxiety. 15 tablet 0   levonorgestrel  (MIRENA ) 20 MCG/DAY IUD 1 each by Intrauterine route once.     loperamide (IMODIUM) 2 MG capsule Take 1 capsule (2 mg total) by mouth 2 (two) times daily as needed for diarrhea or loose stools. 14 capsule 0   ondansetron  (ZOFRAN -ODT) 8 MG disintegrating tablet Take 1 tablet (8 mg total) by mouth every 8 (eight) hours as needed for nausea or  vomiting. 20 tablet 0   sertraline  (ZOLOFT ) 100 MG tablet Take 300 mg by mouth daily.     No current facility-administered medications for this visit.    Medication Side Effects: None  Allergies: No Known Allergies  Past Medical History:  Diagnosis Date   Anxiety    Depression    History of chicken pox    OCD (obsessive compulsive disorder)     Past Medical History, Surgical history, Social history, and Family history were reviewed and updated as appropriate.   Please see review of systems for further details on the patient's review from today.   Objective:   Physical Exam:  There were no vitals taken for this visit.  Physical Exam Constitutional:      Appearance: Normal appearance. She is normal weight.  Neurological:     General: No focal deficit present.     Mental Status: She is alert and oriented to person, place, and time. Mental status is at baseline.  Psychiatric:        Attention and Perception: Attention and perception normal.        Mood and Affect: Mood and affect normal.        Speech: Speech normal.        Behavior: Behavior normal. Behavior is cooperative.        Thought Content: Thought content normal.        Cognition and Memory: Cognition and memory normal.        Judgment: Judgment normal.     Comments: Insight intact     Lab Review:     Component Value Date/Time   NA 138 07/06/2018 0651   K 3.9 07/06/2018 0651   CL 106 07/06/2018 0651   CO2 25 07/06/2018 0651   GLUCOSE 99 07/06/2018 0651   BUN 10 07/06/2018 0651   CREATININE 0.61 07/06/2018 0651   CALCIUM 8.7 (L) 07/06/2018 0651   PROT 6.5 07/06/2018 0651   ALBUMIN 3.9 07/06/2018 0651   AST 16 07/06/2018 0651   ALT 17 07/06/2018 0651   ALKPHOS 52 07/06/2018 0651   BILITOT 0.5 07/06/2018 0651   GFRNONAA >60 07/06/2018 0651   GFRAA >60 07/06/2018 0651       Component Value Date/Time   WBC 6.7 07/06/2018 0651   RBC 4.14 07/06/2018 0651   HGB 12.4 07/06/2018 0651   HCT 38.5  07/06/2018 0651   PLT 235 07/06/2018 0651   MCV 93.0 07/06/2018 0651   MCH 30.0 07/06/2018 0651   MCHC 32.2 07/06/2018 0651   RDW 12.8 07/06/2018 0651    No results found for: POCLITH, LITHIUM   No results found for: PHENYTOIN, PHENOBARB, VALPROATE, CBMZ   .res Assessment: Plan:    Gina Gillespie was seen today for follow-up, anxiety and depression.  Diagnoses and all orders for this visit:  GAD (generalized anxiety disorder)  Panic disorder with agoraphobia  Severe recurrent major depression without psychotic features (HCC)     Please see After Visit Summary for patient specific instructions.  Future Appointments  Date Time Provider Department Center  12/08/2023  4:15 PM Florina Friends, PA-C [facility] None  01/27/2024  3:30 PM Cottle, Lorene KANDICE Raddle., MD [facility] None    No orders of the defined types were placed in this encounter.   I provided approximately 30 minutes of face to face time during this encounter, including time spent before and after the visit in records review, medical decision making, counseling pertinent to today's visit, and charting.   Discussed dx and tx plan. Discussed alternative options including therapy.   PDMP reviewed: low risk trend  Refilled 04/13/2023  Panic Disorder with agoraphobia - not controlled GAD - not controlled Titrate sertraline  back to 300 mg po daily as previously prescribed Continue Xanax  .25-.5mg  TID PRN for anxiety. Refilled Discussed the short-term risks associated with benzodiazepines including sedation and increased fall risk among others. Discussed long-term side effect risk including dependence, potential withdrawal symptoms, and the potential eventual dose-related risk of dementia.   Severe Recurrent Major Depression without psychotic features - not controlled Titrate sertraline  back to 300 mg po daily as previously prescribed  FU - 12/31 at 330 pm with Dr. Lorene Macintosh or sooner if clinically  indicated  Provided work note - 11/12 -11/14 upon patient request.  Risks, benefits, and alternatives of the medications and treatment plan prescribed today were discussed.   Patient engaged in shared decision-making;treatment plan reviewed and agreed upon.    Rena Sweeden PA-C

## 2023-12-23 ENCOUNTER — Ambulatory Visit: Admitting: Psychiatry

## 2023-12-23 ENCOUNTER — Encounter: Payer: Self-pay | Admitting: Psychiatry

## 2023-12-23 DIAGNOSIS — F422 Mixed obsessional thoughts and acts: Secondary | ICD-10-CM | POA: Diagnosis not present

## 2023-12-23 DIAGNOSIS — F902 Attention-deficit hyperactivity disorder, combined type: Secondary | ICD-10-CM

## 2023-12-23 DIAGNOSIS — F332 Major depressive disorder, recurrent severe without psychotic features: Secondary | ICD-10-CM | POA: Diagnosis not present

## 2023-12-23 DIAGNOSIS — F411 Generalized anxiety disorder: Secondary | ICD-10-CM

## 2023-12-23 DIAGNOSIS — F4001 Agoraphobia with panic disorder: Secondary | ICD-10-CM | POA: Diagnosis not present

## 2023-12-23 MED ORDER — ALPRAZOLAM 0.5 MG PO TBDP: ORAL_TABLET | ORAL | 0 refills | Status: AC

## 2023-12-23 MED ORDER — SERTRALINE HCL 100 MG PO TABS: 300.0000 mg | ORAL_TABLET | Freq: Every day | ORAL | 0 refills | Status: AC
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
## 2024-01-27 ENCOUNTER — Ambulatory Visit: Admitting: Psychiatry

## 2024-02-09 ENCOUNTER — Encounter: Payer: Self-pay | Admitting: Certified Nurse Midwife
